# Patient Record
Sex: Female | Born: 1955 | Race: White | Hispanic: No | Marital: Married | Smoking: Former smoker
Health system: Southern US, Community
[De-identification: ages and names within clinical notes are randomized; demographics above are authoritative.]

## PROBLEM LIST (undated history)

## (undated) DIAGNOSIS — E785 Hyperlipidemia, unspecified: Secondary | ICD-10-CM

## (undated) DIAGNOSIS — E039 Hypothyroidism, unspecified: Secondary | ICD-10-CM

## (undated) DIAGNOSIS — G4700 Insomnia, unspecified: Secondary | ICD-10-CM

## (undated) DIAGNOSIS — K219 Gastro-esophageal reflux disease without esophagitis: Secondary | ICD-10-CM

## (undated) DIAGNOSIS — M1711 Unilateral primary osteoarthritis, right knee: Secondary | ICD-10-CM

## (undated) DIAGNOSIS — M0579 Rheumatoid arthritis with rheumatoid factor of multiple sites without organ or systems involvement: Secondary | ICD-10-CM

## (undated) HISTORY — PX: OVARIAN CYST REMOVAL: SHX89

---

## 2007-07-16 ENCOUNTER — Ambulatory Visit: Payer: Self-pay | Admitting: Internal Medicine

## 2007-07-21 ENCOUNTER — Ambulatory Visit: Payer: Self-pay | Admitting: Internal Medicine

## 2008-11-01 ENCOUNTER — Emergency Department: Payer: Self-pay | Admitting: Emergency Medicine

## 2008-12-09 ENCOUNTER — Ambulatory Visit: Payer: Self-pay | Admitting: Unknown Physician Specialty

## 2008-12-17 ENCOUNTER — Ambulatory Visit: Payer: Self-pay | Admitting: Unknown Physician Specialty

## 2010-01-11 ENCOUNTER — Ambulatory Visit: Payer: Self-pay | Admitting: Unknown Physician Specialty

## 2011-02-06 ENCOUNTER — Ambulatory Visit: Payer: Self-pay | Admitting: Unknown Physician Specialty

## 2011-05-30 ENCOUNTER — Ambulatory Visit: Payer: Self-pay | Admitting: Obstetrics and Gynecology

## 2011-05-30 LAB — BASIC METABOLIC PANEL
Calcium, Total: 9 mg/dL (ref 8.5–10.1)
Chloride: 108 mmol/L — ABNORMAL HIGH (ref 98–107)
Co2: 26 mmol/L (ref 21–32)
EGFR (African American): >60
EGFR (Non-African Amer.): >60
Glucose: 100 mg/dL — ABNORMAL HIGH (ref 65–99)
Osmolality: 283 (ref 275–301)
Sodium: 141 mmol/L (ref 136–145)

## 2011-05-30 LAB — WBC: WBC: 8 10*3/uL (ref 3.6–11.0)

## 2011-06-08 ENCOUNTER — Ambulatory Visit: Payer: Self-pay | Admitting: Obstetrics and Gynecology

## 2012-08-08 ENCOUNTER — Emergency Department: Payer: Self-pay | Admitting: Emergency Medicine

## 2012-08-13 ENCOUNTER — Ambulatory Visit: Payer: Self-pay | Admitting: Physician Assistant

## 2012-08-25 ENCOUNTER — Ambulatory Visit: Payer: Self-pay | Admitting: Internal Medicine

## 2013-08-19 ENCOUNTER — Ambulatory Visit (INDEPENDENT_AMBULATORY_CARE_PROVIDER_SITE_OTHER): Payer: BC Managed Care – PPO

## 2013-08-19 ENCOUNTER — Encounter: Payer: Self-pay | Admitting: Podiatry

## 2013-08-19 ENCOUNTER — Ambulatory Visit (INDEPENDENT_AMBULATORY_CARE_PROVIDER_SITE_OTHER): Payer: BC Managed Care – PPO | Admitting: Podiatry

## 2013-08-19 VITALS — Resp 16 | Ht 63.0 in | Wt 172.0 lb

## 2013-08-19 DIAGNOSIS — M79673 Pain in unspecified foot: Secondary | ICD-10-CM

## 2013-08-19 DIAGNOSIS — M79609 Pain in unspecified limb: Secondary | ICD-10-CM

## 2013-08-19 DIAGNOSIS — M722 Plantar fascial fibromatosis: Secondary | ICD-10-CM

## 2013-08-19 MED ORDER — MELOXICAM 15 MG PO TABS: 15.0000 mg | ORAL_TABLET | Freq: Every day | ORAL | Status: DC

## 2013-08-19 MED ORDER — METHYLPREDNISOLONE (PAK) 4 MG PO TABS: ORAL_TABLET | ORAL | Status: DC

## 2013-08-19 NOTE — Progress Notes (Signed)
   Subjective:    Patient ID: Alejandra Wilson, female    DOB: 09-03-1955, 58 y.o.   MRN: [id number]  HPI Comments: i have pain in my left heel. If i walk or stand it hurts. Its been going for about 2 months and its getting worse. i have tried different shoes, otc inserts, and aleve. i have ingrown toenails on all of my toes. i cut my toenails.  Foot Pain      Review of Systems  Musculoskeletal:       Joint pain  All other systems reviewed and are negative.      Objective:   Physical Exam: I have reviewed her past medical history medications allergies surgeries social history and review of systems. Pulses are strongly palpable. Muscle strength is 5 over 5 dorsiflexors plantar inverters everters all intrinsic musculature is intact. Orthopedic evaluation demonstrates pain on palpation medial continued tubercle to the left heel. Radiographic evaluation demonstrates all joints distal to the ankle a full range of motion without crepitation.        Assessment & Plan:  Assessment plantar fasciitis left.  Plan: Injected her left heel today Sterapred Dosepak to be followed on a plantar fascial strapping and a night splint. Medrol Dosepak to be followed by [facility] both oral and written home-going instructions were provided for stretching and ice therapy followup with me in one month

## 2013-08-19 NOTE — Patient Instructions (Signed)
Plantar Fasciitis (Heel Spur Syndrome) with Rehab The plantar fascia is a fibrous, ligament-like, soft-tissue structure that spans the bottom of the foot. Plantar fasciitis is a condition that causes pain in the foot due to inflammation of the tissue. SYMPTOMS   Pain and tenderness on the underneath side of the foot.  Pain that worsens with standing or walking. CAUSES  Plantar fasciitis is caused by irritation and injury to the plantar fascia on the underneath side of the foot. Common mechanisms of injury include:  Direct trauma to bottom of the foot.  Damage to a small nerve that runs under the foot where the main fascia attaches to the heel bone.  Stress placed on the plantar fascia due to bone spurs. RISK INCREASES WITH:   Activities that place stress on the plantar fascia (running, jumping, pivoting, or cutting).  Poor strength and flexibility.  Improperly fitted shoes.  Tight calf muscles.  Flat feet.  Failure to warm-up properly before activity.  Obesity. PREVENTION  Warm up and stretch properly before activity.  Allow for adequate recovery between workouts.  Maintain physical fitness:  Strength, flexibility, and endurance.  Cardiovascular fitness.  Maintain a health body weight.  Avoid stress on the plantar fascia.  Wear properly fitted shoes, including arch supports for individuals who have flat feet. PROGNOSIS  If treated properly, then the symptoms of plantar fasciitis usually resolve without surgery. However, occasionally surgery is necessary. RELATED COMPLICATIONS   Recurrent symptoms that may result in a chronic condition.  Problems of the lower back that are caused by compensating for the injury, such as limping.  Pain or weakness of the foot during push-off following surgery.  Chronic inflammation, scarring, and partial or complete fascia tear, occurring more often from repeated injections. TREATMENT  Treatment initially involves the use of  ice and medication to help reduce pain and inflammation. The use of strengthening and stretching exercises may help reduce pain with activity, especially stretches of the Achilles tendon. These exercises may be performed at home or with a therapist. Your caregiver may recommend that you use heel cups of arch supports to help reduce stress on the plantar fascia. Occasionally, corticosteroid injections are given to reduce inflammation. If symptoms persist for greater than 6 months despite non-surgical (conservative), then surgery may be recommended.  MEDICATION   If pain medication is necessary, then nonsteroidal anti-inflammatory medications, such as aspirin and ibuprofen, or other minor pain relievers, such as acetaminophen, are often recommended.  Do not take pain medication within 7 days before surgery.  Prescription pain relievers may be given if deemed necessary by your caregiver. Use only as directed and only as much as you need.  Corticosteroid injections may be given by your caregiver. These injections should be reserved for the most serious cases, because they may only be given a certain number of times. HEAT AND COLD  Cold treatment (icing) relieves pain and reduces inflammation. Cold treatment should be applied for 10 to 15 minutes every 2 to 3 hours for inflammation and pain and immediately after any activity that aggravates your symptoms. Use ice packs or massage the area with a piece of ice (ice massage).  Heat treatment may be used prior to performing the stretching and strengthening activities prescribed by your caregiver, physical therapist, or athletic trainer. Use a heat pack or soak the injury in warm water. SEEK IMMEDIATE MEDICAL CARE IF:  Treatment seems to offer no benefit, or the condition worsens.  Any medications produce adverse side effects. EXERCISES RANGE   OF MOTION (ROM) AND STRETCHING EXERCISES - Plantar Fasciitis (Heel Spur Syndrome) These exercises may help you  when beginning to rehabilitate your injury. Your symptoms may resolve with or without further involvement from your physician, physical therapist or athletic trainer. While completing these exercises, remember:   Restoring tissue flexibility helps normal motion to return to the joints. This allows healthier, less painful movement and activity.  An effective stretch should be held for at least 30 seconds.  A stretch should never be painful. You should only feel a gentle lengthening or release in the stretched tissue. RANGE OF MOTION - Toe Extension, Flexion  Sit with your right / left leg crossed over your opposite knee.  Grasp your toes and gently pull them back toward the top of your foot. You should feel a stretch on the bottom of your toes and/or foot.  Hold this stretch for __________ seconds.  Now, gently pull your toes toward the bottom of your foot. You should feel a stretch on the top of your toes and or foot.  Hold this stretch for __________ seconds. Repeat __________ times. Complete this stretch __________ times per day.  RANGE OF MOTION - Ankle Dorsiflexion, Active Assisted  Remove shoes and sit on a chair that is preferably not on a carpeted surface.  Place right / left foot under knee. Extend your opposite leg for support.  Keeping your heel down, slide your right / left foot back toward the chair until you feel a stretch at your ankle or calf. If you do not feel a stretch, slide your bottom forward to the edge of the chair, while still keeping your heel down.  Hold this stretch for __________ seconds. Repeat __________ times. Complete this stretch __________ times per day.  STRETCH  Gastroc, Standing  Place hands on wall.  Extend right / left leg, keeping the front knee somewhat bent.  Slightly point your toes inward on your back foot.  Keeping your right / left heel on the floor and your knee straight, shift your weight toward the wall, not allowing your back to  arch.  You should feel a gentle stretch in the right / left calf. Hold this position for __________ seconds. Repeat __________ times. Complete this stretch __________ times per day. STRETCH  Soleus, Standing  Place hands on wall.  Extend right / left leg, keeping the other knee somewhat bent.  Slightly point your toes inward on your back foot.  Keep your right / left heel on the floor, bend your back knee, and slightly shift your weight over the back leg so that you feel a gentle stretch deep in your back calf.  Hold this position for __________ seconds. Repeat __________ times. Complete this stretch __________ times per day. STRETCH  Gastrocsoleus, Standing  Note: This exercise can place a lot of stress on your foot and ankle. Please complete this exercise only if specifically instructed by your caregiver.   Place the ball of your right / left foot on a step, keeping your other foot firmly on the same step.  Hold on to the wall or a rail for balance.  Slowly lift your other foot, allowing your body weight to press your heel down over the edge of the step.  You should feel a stretch in your right / left calf.  Hold this position for __________ seconds.  Repeat this exercise with a slight bend in your right / left knee. Repeat __________ times. Complete this stretch __________ times per day.    STRENGTHENING EXERCISES - Plantar Fasciitis (Heel Spur Syndrome)  These exercises may help you when beginning to rehabilitate your injury. They may resolve your symptoms with or without further involvement from your physician, physical therapist or athletic trainer. While completing these exercises, remember:   Muscles can gain both the endurance and the strength needed for everyday activities through controlled exercises.  Complete these exercises as instructed by your physician, physical therapist or athletic trainer. Progress the resistance and repetitions only as guided. STRENGTH - Towel  Curls  Sit in a chair positioned on a non-carpeted surface.  Place your foot on a towel, keeping your heel on the floor.  Pull the towel toward your heel by only curling your toes. Keep your heel on the floor.  If instructed by your physician, physical therapist or athletic trainer, add ____________________ at the end of the towel. Repeat __________ times. Complete this exercise __________ times per day. STRENGTH - Ankle Inversion  Secure one end of a rubber exercise band/tubing to a fixed object (table, pole). Loop the other end around your foot just before your toes.  Place your fists between your knees. This will focus your strengthening at your ankle.  Slowly, pull your big toe up and in, making sure the band/tubing is positioned to resist the entire motion.  Hold this position for __________ seconds.  Have your muscles resist the band/tubing as it slowly pulls your foot back to the starting position. Repeat __________ times. Complete this exercises __________ times per day.  Document Released: 04/09/2005 Document Revised: 07/02/2011 Document Reviewed: 07/22/2008 [employer] Patient Information 2014 [employer]. Plantar Fasciitis Plantar fasciitis is a common condition that causes foot pain. It is soreness (inflammation) of the band of tough fibrous tissue on the bottom of the foot that runs from the heel bone (calcaneus) to the ball of the foot. The cause of this soreness may be from excessive standing, poor fitting shoes, running on hard surfaces, being overweight, having an abnormal walk, or overuse (this is common in runners) of the painful foot or feet. It is also common in aerobic exercise dancers and ballet dancers. SYMPTOMS  Most people with plantar fasciitis complain of:  Severe pain in the morning on the bottom of their foot especially when taking the first steps out of bed. This pain recedes after a few minutes of walking.  Severe pain is experienced also during walking  following a long period of inactivity.  Pain is worse when walking barefoot or up stairs DIAGNOSIS   Your caregiver will diagnose this condition by examining and feeling your foot.  Special tests such as X-rays of your foot, are usually not needed. PREVENTION   Consult a sports medicine professional before beginning a new exercise program.  Walking programs offer a good workout. With walking there is a lower chance of overuse injuries common to runners. There is less impact and less jarring of the joints.  Begin all new exercise programs slowly. If problems or pain develop, decrease the amount of time or distance until you are at a comfortable level.  Wear good shoes and replace them regularly.  Stretch your foot and the heel cords at the back of the ankle (Achilles tendon) both before and after exercise.  Run or exercise on even surfaces that are not hard. For example, asphalt is better than pavement.  Do not run barefoot on hard surfaces.  If using a treadmill, vary the incline.  Do not continue to workout if you have foot or joint   problems. Seek professional help if they do not improve. HOME CARE INSTRUCTIONS   Avoid activities that cause you pain until you recover.  Use ice or cold packs on the problem or painful areas after working out.  Only take over-the-counter or prescription medicines for pain, discomfort, or fever as directed by your caregiver.  Soft shoe inserts or athletic shoes with air or gel sole cushions may be helpful.  If problems continue or become more severe, consult a sports medicine caregiver or your own health care provider. Cortisone is a potent anti-inflammatory medication that may be injected into the painful area. You can discuss this treatment with your caregiver. MAKE SURE YOU:   Understand these instructions.  Will watch your condition.  Will get help right away if you are not doing well or get worse. Document Released: 01/02/2001 Document  Revised: 07/02/2011 Document Reviewed: 03/03/2008 [employer] Patient Information 2014 [employer].  

## 2013-09-28 ENCOUNTER — Ambulatory Visit (INDEPENDENT_AMBULATORY_CARE_PROVIDER_SITE_OTHER): Payer: BC Managed Care – PPO | Admitting: Podiatry

## 2013-09-28 VITALS — BP 150/83 | HR 61 | Resp 16

## 2013-09-28 DIAGNOSIS — M722 Plantar fascial fibromatosis: Secondary | ICD-10-CM

## 2013-09-28 NOTE — Progress Notes (Signed)
She presents today for followup of her plantar fasciitis left heel. She states it continues to hurt a regular basis. He starting to affect her daily activities.  Objective: R. signs are stable she is alert and oriented x3. Pulses are strongly palpable bilateral.  Assessment: Pain in limb secondary to plantar fasciitis left heel.  Plan: Injected the left heel once again today continue all other conservative therapies. She was scanned for a pair orthotics. I will followup with her in one month.

## 2013-10-09 ENCOUNTER — Encounter: Payer: Self-pay | Admitting: *Deleted

## 2013-10-09 NOTE — Progress Notes (Signed)
Sent pt post card letting her know orthotics are here.

## 2013-10-14 ENCOUNTER — Ambulatory Visit (INDEPENDENT_AMBULATORY_CARE_PROVIDER_SITE_OTHER): Payer: BC Managed Care – PPO | Admitting: Podiatry

## 2013-10-14 ENCOUNTER — Encounter: Payer: Self-pay | Admitting: Podiatry

## 2013-10-14 DIAGNOSIS — M722 Plantar fascial fibromatosis: Secondary | ICD-10-CM

## 2013-10-14 NOTE — Progress Notes (Signed)
Pt presents for orthotic pick up written and verbal instructions are given 

## 2013-10-14 NOTE — Patient Instructions (Signed)

## 2013-11-09 ENCOUNTER — Ambulatory Visit (INDEPENDENT_AMBULATORY_CARE_PROVIDER_SITE_OTHER): Payer: BC Managed Care – PPO | Admitting: Podiatry

## 2013-11-09 ENCOUNTER — Encounter: Payer: Self-pay | Admitting: Podiatry

## 2013-11-09 DIAGNOSIS — M722 Plantar fascial fibromatosis: Secondary | ICD-10-CM

## 2013-11-09 MED ORDER — MELOXICAM 15 MG PO TABS: 15.0000 mg | ORAL_TABLET | Freq: Every day | ORAL | Status: DC

## 2013-11-09 NOTE — Progress Notes (Signed)
She presents today for followup of her plantar fasciitis and her orthotics. 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 She relates that the heel pain seems to be resolving.  Objective: Vital signs are stable she is alert and oriented x3. She has minimal pain on palpation medial calcaneal tubercle of the left heel much less than previously noted.  Assessment: Plantar fasciitis resolving left heel.  Plan: Continue all conservative therapies and the use of the orthotics

## 2013-11-25 ENCOUNTER — Ambulatory Visit: Payer: Self-pay | Admitting: Physician Assistant

## 2014-12-29 ENCOUNTER — Other Ambulatory Visit: Payer: Self-pay | Admitting: Physician Assistant

## 2014-12-29 DIAGNOSIS — Z1231 Encounter for screening mammogram for malignant neoplasm of breast: Secondary | ICD-10-CM

## 2015-01-06 ENCOUNTER — Ambulatory Visit: Payer: Self-pay

## 2015-01-10 ENCOUNTER — Ambulatory Visit: Admission: RE | Admit: 2015-01-10 | Payer: Self-pay | Source: Ambulatory Visit

## 2015-01-18 ENCOUNTER — Ambulatory Visit
Admission: RE | Admit: 2015-01-18 | Discharge: 2015-01-18 | Disposition: A | Discharge status: Home / Self Care | Payer: BLUE CROSS/BLUE SHIELD | Source: Ambulatory Visit | Attending: Physician Assistant | Admitting: Physician Assistant

## 2015-01-18 DIAGNOSIS — Z1231 Encounter for screening mammogram for malignant neoplasm of breast: Secondary | ICD-10-CM | POA: Diagnosis not present

## 2016-02-07 ENCOUNTER — Other Ambulatory Visit: Payer: Self-pay | Admitting: Physician Assistant

## 2016-02-07 DIAGNOSIS — Z1231 Encounter for screening mammogram for malignant neoplasm of breast: Secondary | ICD-10-CM

## 2016-03-08 ENCOUNTER — Ambulatory Visit
Admission: RE | Admit: 2016-03-08 | Discharge: 2016-03-08 | Disposition: A | Discharge status: Home / Self Care | Payer: BLUE CROSS/BLUE SHIELD | Source: Ambulatory Visit | Attending: Physician Assistant | Admitting: Physician Assistant

## 2016-03-08 DIAGNOSIS — Z1231 Encounter for screening mammogram for malignant neoplasm of breast: Secondary | ICD-10-CM | POA: Diagnosis not present

## 2016-07-05 ENCOUNTER — Ambulatory Visit: Payer: BLUE CROSS/BLUE SHIELD | Attending: Rheumatology | Admitting: Occupational Therapy

## 2016-07-05 DIAGNOSIS — M79642 Pain in left hand: Secondary | ICD-10-CM

## 2016-07-05 DIAGNOSIS — M25642 Stiffness of left hand, not elsewhere classified: Secondary | ICD-10-CM | POA: Diagnosis present

## 2016-07-05 DIAGNOSIS — M79641 Pain in right hand: Secondary | ICD-10-CM | POA: Insufficient documentation

## 2016-07-05 DIAGNOSIS — M25641 Stiffness of right hand, not elsewhere classified: Secondary | ICD-10-CM | POA: Insufficient documentation

## 2016-07-05 NOTE — Therapy (Signed)
[facility] [address] [place], [address] Phone: [phone]   Fax:  [phone]  Occupational Therapy Evaluation  Patient Details  Name: Alejandra Wilson MRN: [id number] Date of Birth: July 17, 1955 Referring Provider: Jefm Bryant   Encounter Date: 07/05/2016      OT End of Session - 07/05/16 0902    Visit Number 1   Number of Visits 3   Date for OT Re-Evaluation 08/02/16   OT Start Time 0808   OT Stop Time 0900   OT Time Calculation (min) 52 min   Activity Tolerance Patient tolerated treatment well   Behavior During Therapy [facility] for tasks assessed/performed      No past medical history on file.  Past Surgical History:  Procedure Laterality Date  . OVARIAN CYST REMOVAL      There were no vitals filed for this visit.      Subjective Assessment - 07/05/16 0814    Subjective  Stiffness in am - pain increase in digits as the days goes, hard time opening  objects - did wear wrist braces - some times more than other - glove with copper when cold    Patient Stated Goals Want to get the pain better and maintain ROM and strength    Currently in Pain? Yes   Pain Score 5    Pain Location Hand   Pain Orientation Right;Left   Pain Descriptors / Indicators Aching   Pain Type Chronic pain           [facility] OT Assessment - 07/05/16 0001      Assessment   Diagnosis Bilateral hands pain    Referring Provider [facility]    Onset Date 06/25/16     Precautions   Required Braces or Orthoses --  wrist braces at times     Home  Environment   Lives With Family     Prior Function   Vocation Full time employment   Leisure Day care teacher, mowing , yard work ,Loss adjuster, chartered , own house wor- R hand dominant k      Strength   Right Hand Grip (lbs) 36   Right Hand Lateral Pinch 11 lbs   Right Hand 3 Point Pinch 7 lbs   Left Hand Grip (lbs) 35   Left Hand Lateral Pinch 10 lbs   Left Hand 3 Point Pinch 7 lbs     Right Hand AROM   R Thumb Opposition to Index --  Opposition to base of 5th  some discomfort at 4thad nth   R Index  MCP 0-90 70 Degrees   R Index PIP 0-100 100 Degrees   R Long  MCP 0-90 75 Degrees   R Long PIP 0-100 100 Degrees   R Ring  MCP 0-90 75 Degrees   R Ring PIP 0-100 100 Degrees   R Little  MCP 0-90 80 Degrees   R Little PIP 0-100 95 Degrees     Left Hand AROM   L Thumb Opposition to Index --  Opposition to base of 5th   L Index  MCP 0-90 85 Degrees   L Index PIP 0-100 90 Degrees   L Long  MCP 0-90 80 Degrees   L Long PIP 0-100 90 Degrees   L Ring  MCP 0-90 80 Degrees   L Ring PIP 0-100 90 Degrees   L Little  MCP 0-90 80 Degrees   L Little PIP 0-100 90 Degrees  Contrast to be done 2 x day  Tendon glides  Opposition to all digits Isotoner glove at night time on bilateral hands 10 reps each   Joint protection done and AE - reviewed  Hand out provided                  OT Education - 07/05/16 0902    Education provided Yes   Education Details Findings and HEP and jointprotection/AE   Person(s) Educated Patient   Methods Explanation;Demonstration;Verbal cues;Handout;Tactile cues   Comprehension Verbal cues required;Returned demonstration;Verbalized understanding          OT Short Term Goals - 07/05/16 1730      OT SHORT TERM GOAL #1   Title Pain on PRHWE improve with at least 15 points    Baseline Pain on PRHWE at eval 32/50    Time 3   Period Weeks   Status New     OT SHORT TERM GOAL #2   Title Pt to be ind in HEP to increase  and maintain ROM in digits  and  decrease pain    Baseline very little knowledge    Time 2   Period Weeks   Status New           OT Long Term Goals - 07/05/16 1733      OT LONG TERM GOAL #1   Title Pt to verbalize 3 joint protection and AE to use at home and work to increase ROM and decrease pain in bilateral hands    Baseline no knowledge    Time 4   Period Weeks   Status New     OT  LONG TERM GOAL #2   Title Bilateral grip strength and 3 point grip  improve with 1-3 lbs to report increase use of hands in ADL's    Baseline see flowsheet    Time 4   Period Weeks   Status New               Plan - 07/05/16 1727    Clinical Impression Statement Pt show increase pain in bilateral hands - with R worse than L , but ROM in L worse than R - pt show increase edema over MC's in bilateral hands - with pain increasing as the day goes or increase activities - decrease flexion of digits, decrease  grip and prehension strength - all limiting her functional use of hands in ADL's and IADL's - pt denies numbness or sensory changes    Rehab Potential Good   Clinical Impairments Affecting Rehab Potential chronic condition   OT Frequency 1x / week   OT Duration 4 weeks      Patient will benefit from skilled therapeutic intervention in order to improve the following deficits and impairments:  Impaired flexibility, Increased edema, Impaired UE functional use, Pain, Decreased strength, Decreased knowledge of use of DME  Visit Diagnosis: Pain in left hand - Plan: Ot plan of care cert/re-cert  Pain in right hand - Plan: Ot plan of care cert/re-cert  Stiffness of left hand, not elsewhere classified - Plan: Ot plan of care cert/re-cert  Stiffness of right hand, not elsewhere classified - Plan: Ot plan of care cert/re-cert    Problem List There are no active problems to display for this patient.   Rosalyn Gess OTR/LCLT 07/05/2016, 5:39 PM  [place] [address], [place], [address] Phone: [phone]   Fax:  [phone]  Name:  Alejandra Wilson MRN: [id number] Date of Birth: 05-28-1955

## 2016-07-05 NOTE — Patient Instructions (Signed)
Contrast to be done 2 x day  Tendon glides  Opposition to all digits Isotoner glove at night time on bilateral hands 10 reps each   Joint protection done and AE - reviewed  Hand out provided

## 2016-07-12 ENCOUNTER — Ambulatory Visit: Payer: BLUE CROSS/BLUE SHIELD | Admitting: Occupational Therapy

## 2016-07-12 DIAGNOSIS — M79641 Pain in right hand: Secondary | ICD-10-CM

## 2016-07-12 DIAGNOSIS — M79642 Pain in left hand: Secondary | ICD-10-CM

## 2016-07-12 DIAGNOSIS — M25641 Stiffness of right hand, not elsewhere classified: Secondary | ICD-10-CM

## 2016-07-12 DIAGNOSIS — M25642 Stiffness of left hand, not elsewhere classified: Secondary | ICD-10-CM

## 2016-07-12 NOTE — Patient Instructions (Addendum)
Same HEP - contrast and   Not force intrinsic fist , or full fist  Change  Tendon glides  - to stop when feeling pull with intrinsic fist and full fist to 3 cm foam block  10 reps  And all digits at same time- pt lacking with 4th and 5th  Opposition to all digits - focus on oval and only touching Isotoner glove at night time on bilateral hands ( can wear during day if needed)  10 reps each   Joint protection and  AE  reviewed  Again - and reinforce importance - not to work with tight fist - mostly L hand - because unable to make full fist  Hand out provided

## 2016-07-12 NOTE — Therapy (Signed)
[facility] [address] [place], [address] Phone: [phone]   Fax:  [phone]  Occupational Therapy Treatment  Patient Details  Name: Alejandra Wilson MRN: [id number] Date of Birth: 1955/09/14 Referring Provider: Jefm Bryant   Encounter Date: 07/12/2016      OT End of Session - 07/12/16 0821    Visit Number 2   Number of Visits 3   Date for OT Re-Evaluation 08/02/16   OT Start Time 0809   OT Stop Time 0848   OT Time Calculation (min) 39 min   Activity Tolerance Patient tolerated treatment well   Behavior During Therapy [facility] for tasks assessed/performed      No past medical history on file.  Past Surgical History:  Procedure Laterality Date  . OVARIAN CYST REMOVAL      There were no vitals filed for this visit.      Subjective Assessment - 07/12/16 0809    Subjective  Hands about the same - still hurting - but L hand I cannot make as good of fist like before - pull over the back of fingers - I did try and pick up with my forearm, fatter ,bigger handles , did get a jar opener    Patient Stated Goals Want to get the pain better and maintain ROM and strength    Currently in Pain? Yes   Pain Score 6    Pain Location Hand   Pain Orientation Right;Left   Pain Descriptors / Indicators Aching   Pain Type Chronic pain            [facility] OT Assessment - 07/12/16 0001      Strength   Right Hand Grip (lbs) 36   Right Hand Lateral Pinch 15 lbs   Right Hand 3 Point Pinch 10 lbs   Left Hand Grip (lbs) --  NT pain    Left Hand Lateral Pinch 9 lbs   Left Hand 3 Point Pinch 9 lbs     Right Hand AROM   R Index  MCP 0-90 80 Degrees   R Long  MCP 0-90 82 Degrees   R Ring  MCP 0-90 75 Degrees   R Little  MCP 0-90 85 Degrees     Left Hand AROM   L Index  MCP 0-90 85 Degrees   L Long  MCP 0-90 85 Degrees   L Long PIP 0-100 80 Degrees   L Ring  MCP 0-90 85 Degrees   L Ring PIP 0-100 80 Degrees    L Little  MCP 0-90 80 Degrees   L Little PIP 0-100 80 Degrees                  OT Treatments/Exercises (OP) - 07/12/16 0001      RUE Paraffin   Number Minutes Paraffin 10 Minutes   RUE Paraffin Location Hand   Comments at [facility] to decrease pain and stiffness      LUE Paraffin   Number Minutes Paraffin 10 Minutes   LUE Paraffin Location Hand   Comments at [facility] to decrease stiffness and pain       Measurements taken See flow sheet   Paraffin done to bilateral hands  Soft tissue mobs - MC spreads and joint mobs to each [facility]  Massage to lateral bands of PIP's to increase ROM and decrease pain   Review HEP with pt - to mod A  Not force intrinsic fist , or full  fist  Change  Tendon glides  - to stop when feeling pull with intrinsic fist and full fist to 3 cm foam block  10 reps  And all digits at same time- pt lacking with 4th and 5th  Opposition to all digits - focus on oval and only touching Isotoner glove at night time on bilateral hands ( can wear during day if needed)  10 reps each   Joint protection and  AE  reviewed  Again - and reinforce importance - not to work with tight fist - mostly L hand - because unable to make full fist  Hand out provided            OT Education - 07/12/16 0821    Education provided Yes   Education Details HEP and changes - as well as reinforce joint protection    Person(s) Educated Patient   Methods Explanation;Demonstration;Verbal cues;Tactile cues   Comprehension Verbal cues required;Returned demonstration;Verbalized understanding          OT Short Term Goals - 07/05/16 1730      OT SHORT TERM GOAL #1   Title Pain on PRHWE improve with at least 15 points    Baseline Pain on PRHWE at eval 32/50    Time 3   Period Weeks   Status New     OT SHORT TERM GOAL #2   Title Pt to be ind in HEP to increase  and maintain ROM in digits  and  decrease pain    Baseline very little knowledge    Time 2   Period Weeks   Status New            OT Long Term Goals - 07/05/16 1733      OT LONG TERM GOAL #1   Title Pt to verbalize 3 joint protection and AE to use at home and work to increase ROM and decrease pain in bilateral hands    Baseline no knowledge    Time 4   Period Weeks   Status New     OT LONG TERM GOAL #2   Title Bilateral grip strength and 3 point grip  improve with 1-3 lbs to report increase use of hands in ADL's    Baseline see flowsheet    Time 4   Period Weeks   Status New               Plan - 07/12/16 9323    Clinical Impression Statement Pt show increase ROM in MC's flexion - but L PIP's decrease and increase pain this date - pt not to force intrinsic fist and composite fist - change HEP and reinforce use of joint protection - responded good to paraffin - decrease pain - recommend to use at home    Rehab Potential Good   Clinical Impairments Affecting Rehab Potential chronic condition   OT Frequency 2x / week   OT Duration 4 weeks   OT Treatment/Interventions Self-care/ADL training;Parrafin;Manual Therapy;Passive range of motion;Therapeutic exercises;Splinting;Fluidtherapy;Patient/family education   Plan assess progress    OT Home Exercise Plan see pt instruction    Consulted and Agree with Plan of Care Patient      Patient will benefit from skilled therapeutic intervention in order to improve the following deficits and impairments:  Impaired flexibility, Increased edema, Impaired UE functional use, Pain, Decreased strength, Decreased knowledge of use of DME  Visit Diagnosis: Pain in left hand  Pain in right hand  Stiffness of left hand, not elsewhere classified  Stiffness of right hand, not elsewhere classified    Problem List There are no active problems to display for this patient.   Rosalyn Gess OTR/L,CLT 07/12/2016, 8:51 AM  [facility] [address] [place], [address] Phone:  [phone]   Fax:  [phone]  Name: Alejandra Wilson MRN: [id number] Date of Birth: 10/19/1955

## 2016-07-23 ENCOUNTER — Ambulatory Visit: Payer: BLUE CROSS/BLUE SHIELD | Attending: Rheumatology | Admitting: Occupational Therapy

## 2016-07-23 DIAGNOSIS — M25641 Stiffness of right hand, not elsewhere classified: Secondary | ICD-10-CM

## 2016-07-23 DIAGNOSIS — M79641 Pain in right hand: Secondary | ICD-10-CM | POA: Diagnosis present

## 2016-07-23 DIAGNOSIS — M25642 Stiffness of left hand, not elsewhere classified: Secondary | ICD-10-CM

## 2016-07-23 DIAGNOSIS — M79642 Pain in left hand: Secondary | ICD-10-CM

## 2016-07-23 NOTE — Patient Instructions (Signed)
ed pt on PROM for composite flexion on the L more than R pain free range   Pt to do with R hand to Pt ed and reinforce importance in maintaining her ROM  If increase pain  Also to do AROM - blocked for PIP flexion - but not force or tense up   Discuss circle for increase pain , decrease ROM and decrease strength- Recommend paraffin bath for pt to use at home

## 2016-07-23 NOTE — Therapy (Signed)
[facility] [address] [place], [address] Phone: [phone]   Fax:  [phone]  Occupational Therapy Treatment  Patient Details  Name: GISSEL KEILMAN MRN: [id number] Date of Birth: [birth year] Referring Provider: Jefm Bryant   Encounter Date: 07/23/2016      OT End of Session - 07/23/16 1002    Visit Number 3   Number of Visits 4   Date for OT Re-Evaluation 08/02/16   OT Start Time 0918   OT Stop Time 0948   OT Time Calculation (min) 30 min   Activity Tolerance Patient tolerated treatment well;Patient limited by pain   Behavior During Therapy [facility] for tasks assessed/performed      No past medical history on file.  Past Surgical History:  Procedure Laterality Date  . OVARIAN CYST REMOVAL      There were no vitals filed for this visit.      Subjective Assessment - 07/23/16 0932    Subjective  I told you, I have good days and bad days - pain since last night - did not sleep good- did sleep with the isotoner glove    Patient Stated Goals Want to get the pain better and maintain ROM and strength    Currently in Pain? Yes   Pain Score 9    Pain Location Hand   Pain Orientation Left   Pain Descriptors / Indicators Aching   Pain Type Chronic pain                      OT Treatments/Exercises (OP) - 07/23/16 0001      RUE Paraffin   Number Minutes Paraffin 10 Minutes   RUE Paraffin Location Hand   Comments AT [facility] to decrease pain and increase ROM      LUE Paraffin   Number Minutes Paraffin 10 Minutes   LUE Paraffin Location Hand   Comments at [facility] to decrease pain and increase ROM       Pt arrive with decrease AROM and increase pain  in bilateral hands -upon asking to make fist on L - pt only flex MC's - no PIP flexion  Paraffin done to bilateral hands Soft tissue mobs to lateral bands of PIP's and joint mobs to MC's prior to ROM   Still unable to flex PIP's  Done and ed pt  on PROM for composite flexion on the L more than R pain free rnage Full flexion PROM for 2nd and 5th - no pain  Some tightness in 3rd and 4th - partial ROM  Pt to do with R hand to SHowed increase ROM in bilateral hands compare to arrival Pt ed and reinforce importance in maintaining her ROM  If increase pain  Also to do AROM - blocked for PIP flexion - but not force or tense up   Discuss circle for increase pain , decrease ROM and decrease strength- Recommend paraffin bath for pt to use at home  Pain decrease to 4/10 from 9/10             OT Education - 07/23/16 1002    Education provided Yes   Education Details HEP and add painfree composite flexion PROM    Person(s) Educated Patient   Methods Explanation;Tactile cues;Verbal cues;Demonstration;Handout   Comprehension Verbalized understanding;Returned demonstration;Verbal cues required          OT Short Term Goals - 07/05/16 1730      OT SHORT TERM GOAL #1  Title Pain on PRHWE improve with at least 15 points    Baseline Pain on PRHWE at eval 32/50    Time 3   Period Weeks   Status New     OT SHORT TERM GOAL #2   Title Pt to be ind in HEP to increase  and maintain ROM in digits  and  decrease pain    Baseline very little knowledge    Time 2   Period Weeks   Status New           OT Long Term Goals - 07/05/16 1733      OT LONG TERM GOAL #1   Title Pt to verbalize 3 joint protection and AE to use at home and work to increase ROM and decrease pain in bilateral hands    Baseline no knowledge    Time 4   Period Weeks   Status New     OT LONG TERM GOAL #2   Title Bilateral grip strength and 3 point grip  improve with 1-3 lbs to report increase use of hands in ADL's    Baseline see flowsheet    Time 4   Period Weeks   Status New               Plan - 07/23/16 1003    Clinical Impression Statement Pt show increase pain this date - report she has good days and bad days- did not do more activities  12-24 hrs ago - pain did decrease with paraffin from 9/10 - to 4/10 - reinforce pt to do composite PROM to each finger in painfree range at home to no loose ROM if have increase pain - recommend paraffin bath - pt  is on voltaren and cont to have increase edema over MC's -  doing contrast and isotoner gloves - will follow upin 2 wks with pt    Rehab Potential Good   Clinical Impairments Affecting Rehab Potential chronic condition   OT Frequency 1x / week   OT Duration 2 weeks   OT Treatment/Interventions Self-care/ADL training;Parrafin;Manual Therapy;Passive range of motion;Therapeutic exercises;Splinting;Fluidtherapy;Patient/family education   Plan assess pain - if got paraffin bath - performance of ROM HEP    OT Home Exercise Plan see pt instruction    Consulted and Agree with Plan of Care Patient      Patient will benefit from skilled therapeutic intervention in order to improve the following deficits and impairments:  Impaired flexibility, Increased edema, Impaired UE functional use, Pain, Decreased strength, Decreased knowledge of use of DME  Visit Diagnosis: Pain in left hand  Pain in right hand  Stiffness of left hand, not elsewhere classified  Stiffness of right hand, not elsewhere classified    Problem List There are no active problems to display for this patient.   Rosalyn Gess OTR/L,CLT 07/23/2016, 10:06 AM  [facility] [address] [place], [address] Phone: [phone]   Fax:  [phone]  Name: BRIAUNNA GRINDSTAFF MRN: [id number] Date of Birth: 1955/06/24

## 2016-08-09 ENCOUNTER — Ambulatory Visit: Payer: BLUE CROSS/BLUE SHIELD | Admitting: Occupational Therapy

## 2016-08-14 ENCOUNTER — Ambulatory Visit: Payer: BLUE CROSS/BLUE SHIELD | Admitting: Occupational Therapy

## 2016-08-14 DIAGNOSIS — M79642 Pain in left hand: Secondary | ICD-10-CM | POA: Diagnosis not present

## 2016-08-14 DIAGNOSIS — M79641 Pain in right hand: Secondary | ICD-10-CM

## 2016-08-14 DIAGNOSIS — M25641 Stiffness of right hand, not elsewhere classified: Secondary | ICD-10-CM

## 2016-08-14 DIAGNOSIS — M25642 Stiffness of left hand, not elsewhere classified: Secondary | ICD-10-CM

## 2016-08-14 NOTE — Therapy (Signed)
[facility] [address], [place], [address] Phone: [phone]   Fax:  [phone]  Occupational Therapy Treatment  Patient Details  Name: Alejandra Wilson MRN: [id number] Date of Birth: [birth year] Referring Provider: Jefm Bryant   Encounter Date: 08/14/2016      OT End of Session - 08/14/16 0826    Visit Number 4   Number of Visits 6   Date for OT Re-Evaluation 09/11/16   OT Start Time 0805   OT Stop Time 0840   OT Time Calculation (min) 35 min   Activity Tolerance Patient tolerated treatment well   Behavior During Therapy [facility] for tasks assessed/performed      No past medical history on file.  Past Surgical History:  Procedure Laterality Date  . OVARIAN CYST REMOVAL      There were no vitals filed for this visit.      Subjective Assessment - 08/14/16 0808    Subjective  My L hand still ache, and night time worse - cannot make fist on the L - do heat and then some exercises- the gloves at night time do help - I dropped the other day a pan- R hand doing okay    Patient Stated Goals Want to get the pain better and maintain ROM and strength    Currently in Pain? Yes   Pain Score 4    Pain Location Hand   Pain Orientation Left   Pain Descriptors / Indicators Aching            [facility] OT Assessment - 08/14/16 0001      Strength   Right Hand Grip (lbs) 39   Right Hand Lateral Pinch 16 lbs   Right Hand 3 Point Pinch 11 lbs   Left Hand Grip (lbs) 25   Left Hand Lateral Pinch 10 lbs   Left Hand 3 Point Pinch 8 lbs     Right Hand AROM   R Index  MCP 0-90 75 Degrees   R Long  MCP 0-90 80 Degrees   R Ring  MCP 0-90 85 Degrees   R Little  MCP 0-90 90 Degrees     Left Hand AROM   L Index  MCP 0-90 90 Degrees   L Index PIP 0-100 50 Degrees   L Long  MCP 0-90 90 Degrees   L Long PIP 0-100 70 Degrees   L Ring  MCP 0-90 90 Degrees   L Ring PIP 0-100 60 Degrees   L Little  MCP 0-90 90 Degrees    L Little PIP 0-100 50 Degrees                  OT Treatments/Exercises (OP) - 08/14/16 0001      RUE Paraffin   Number Minutes Paraffin 10 Minutes   RUE Paraffin Location Hand   Comments at [facility] to decrease pain and increase ROM      LUE Paraffin   Number Minutes Paraffin 10 Minutes   LUE Paraffin Location Hand   Comments at [facility] to decrease pain and increase ROM       Pt arrive with decrease AROM still in L hand PIP's and composite flexion  Still increase pain in L more than R  Measurements taken for ROM and grip/prehension - see flowsheet   Discuss use of joint protection principles - pt do use larger joints and built up handles - avoid tight grip -  Use isotoner gloves at  night time - do help    Paraffin done to bilateral hands Soft tissue mobs to lateral bands of PIP's and joint mobs to MC's prior to ROM on L  Still pain with flexion of  PIP's on L - 3rd and 4th worse  Full flexion PROM for 2nd and 5th - no pain  Some tightness in 3rd and 4th - partial ROM and increase pain    Discuss circle for increase pain , decrease ROM and decrease strength- Recommend paraffin bath for pt to use at home - but if swelling in MC's /palm - will recommend contrast  Plan to phone Dr Jefm Bryant for intervention for persistent pain and edema in L MC's             OT Education - 08/14/16 0826    Education provided Yes   Education Details plan for home program and will contact Dr Berenice Primas) Educated Patient   Methods Explanation;Demonstration;Tactile cues;Verbal cues   Comprehension Verbal cues required;Returned demonstration;Verbalized understanding          OT Short Term Goals - 08/14/16 0846      OT SHORT TERM GOAL #1   Title Pain on PRHWE improve with at least 15 points    Baseline Pain on PRHWE at eval 32/50 - pain at AROM still 5/10 on L    Time 3   Period Weeks   Status On-going     OT SHORT TERM GOAL #2   Title Pt to be ind in HEP to  increase  and maintain ROM in digits  and  decrease pain    Baseline R hand improve , L decrease ROM and increase pain    Time 2   Period Weeks   Status On-going           OT Long Term Goals - 08/14/16 0847      OT LONG TERM GOAL #1   Title Pt to verbalize 3 joint protection and AE to use at home and work to increase ROM and decrease pain in bilateral hands    Status Achieved     OT LONG TERM GOAL #2   Title Bilateral grip strength and 3 point grip  improve with 1-3 lbs to report increase use of hands in ADL's    Baseline R hand improve but L decrease since eval    Time 4   Period Weeks   Status On-going               Plan - 08/14/16 0827    Clinical Impression Statement Pt showed increase ROM and grip/prehension in R hand - but since 5 wks ago L hand flexion at PIP's decrease and composite fist - pain and edema still over MC's - will contact Dr Jefm Bryant for intervention to decrase pain - pt still PROM  WNL at L 2nd and 5th - but pain limiting her flexion at 3rd and 4th - and AROM composite fist - at risk fore loosing permanent flexion of digits- cont with contrast and ROM  HEP pain free range    Rehab Potential Good   Clinical Impairments Affecting Rehab Potential chronic condition   OT Frequency Biweekly   OT Duration 4 weeks   OT Treatment/Interventions Self-care/ADL training;Parrafin;Manual Therapy;Passive range of motion;Therapeutic exercises;Splinting;Fluidtherapy;Patient/family education   Plan Phone MD intervention for pain and edema L MC's - will follow up if needed    OT Home Exercise Plan see pt instruction    Consulted and Agree  with Plan of Care Patient      Patient will benefit from skilled therapeutic intervention in order to improve the following deficits and impairments:  Impaired flexibility, Increased edema, Impaired UE functional use, Pain, Decreased strength, Decreased knowledge of use of DME  Visit Diagnosis: Pain in left hand  Pain in right  hand  Stiffness of left hand, not elsewhere classified  Stiffness of right hand, not elsewhere classified    Problem List There are no active problems to display for this patient.   Rosalyn Gess OTR/L,CLT 08/14/2016, 8:49 AM  [facility] [address], [place], [address] Phone: [phone]   Fax:  [phone]  Name: Alejandra Wilson MRN: [id number] Date of Birth: 1955-08-21

## 2016-08-14 NOTE — Patient Instructions (Addendum)
Same ROM , contrast  Compression glove and  Joint protection

## 2016-08-21 DIAGNOSIS — F5101 Primary insomnia: Secondary | ICD-10-CM | POA: Insufficient documentation

## 2017-02-27 ENCOUNTER — Other Ambulatory Visit: Payer: Self-pay | Admitting: Physician Assistant

## 2017-02-27 DIAGNOSIS — Z1231 Encounter for screening mammogram for malignant neoplasm of breast: Secondary | ICD-10-CM

## 2017-03-27 ENCOUNTER — Ambulatory Visit
Admission: RE | Admit: 2017-03-27 | Discharge: 2017-03-27 | Disposition: A | Discharge status: Home / Self Care | Payer: BLUE CROSS/BLUE SHIELD | Source: Ambulatory Visit | Attending: Physician Assistant | Admitting: Physician Assistant

## 2017-03-27 DIAGNOSIS — Z1231 Encounter for screening mammogram for malignant neoplasm of breast: Secondary | ICD-10-CM | POA: Diagnosis present

## 2018-04-30 ENCOUNTER — Other Ambulatory Visit: Payer: Self-pay | Admitting: Physician Assistant

## 2018-04-30 DIAGNOSIS — Z1231 Encounter for screening mammogram for malignant neoplasm of breast: Secondary | ICD-10-CM

## 2018-05-19 ENCOUNTER — Ambulatory Visit
Admission: RE | Admit: 2018-05-19 | Discharge: 2018-05-19 | Disposition: A | Discharge status: Home / Self Care | Payer: BLUE CROSS/BLUE SHIELD | Source: Ambulatory Visit | Attending: Physician Assistant | Admitting: Physician Assistant

## 2018-05-19 DIAGNOSIS — Z1231 Encounter for screening mammogram for malignant neoplasm of breast: Secondary | ICD-10-CM | POA: Diagnosis present

## 2019-06-01 ENCOUNTER — Other Ambulatory Visit: Payer: Self-pay | Admitting: Physician Assistant

## 2019-06-01 DIAGNOSIS — Z1231 Encounter for screening mammogram for malignant neoplasm of breast: Secondary | ICD-10-CM

## 2019-06-02 ENCOUNTER — Other Ambulatory Visit: Payer: Self-pay | Admitting: Student

## 2019-06-02 DIAGNOSIS — M25562 Pain in left knee: Secondary | ICD-10-CM

## 2019-06-02 DIAGNOSIS — S83242A Other tear of medial meniscus, current injury, left knee, initial encounter: Secondary | ICD-10-CM

## 2019-06-02 DIAGNOSIS — M1712 Unilateral primary osteoarthritis, left knee: Secondary | ICD-10-CM

## 2019-06-04 ENCOUNTER — Ambulatory Visit
Admission: RE | Admit: 2019-06-04 | Discharge: 2019-06-04 | Disposition: A | Discharge status: Home / Self Care | Payer: BC Managed Care – PPO | Source: Ambulatory Visit | Attending: Student | Admitting: Student

## 2019-06-04 ENCOUNTER — Ambulatory Visit: Payer: BC Managed Care – PPO

## 2019-06-04 ENCOUNTER — Other Ambulatory Visit: Payer: Self-pay

## 2019-06-04 DIAGNOSIS — M1712 Unilateral primary osteoarthritis, left knee: Secondary | ICD-10-CM | POA: Diagnosis present

## 2019-06-04 DIAGNOSIS — S83242A Other tear of medial meniscus, current injury, left knee, initial encounter: Secondary | ICD-10-CM | POA: Diagnosis present

## 2019-06-04 DIAGNOSIS — M25562 Pain in left knee: Secondary | ICD-10-CM | POA: Diagnosis present

## 2019-06-04 DIAGNOSIS — X58XXXA Exposure to other specified factors, initial encounter: Secondary | ICD-10-CM | POA: Diagnosis not present

## 2019-06-18 ENCOUNTER — Other Ambulatory Visit: Payer: Self-pay | Admitting: Surgery

## 2019-06-29 ENCOUNTER — Ambulatory Visit
Admission: RE | Admit: 2019-06-29 | Discharge: 2019-06-29 | Disposition: A | Discharge status: Home / Self Care | Payer: BC Managed Care – PPO | Source: Ambulatory Visit | Attending: Physician Assistant | Admitting: Physician Assistant

## 2019-06-29 DIAGNOSIS — Z1231 Encounter for screening mammogram for malignant neoplasm of breast: Secondary | ICD-10-CM | POA: Insufficient documentation

## 2019-06-30 ENCOUNTER — Other Ambulatory Visit: Payer: Self-pay

## 2019-06-30 ENCOUNTER — Other Ambulatory Visit: Payer: BC Managed Care – PPO

## 2019-06-30 ENCOUNTER — Encounter
Admission: RE | Admit: 2019-06-30 | Discharge: 2019-06-30 | Disposition: A | Discharge status: Home / Self Care | Payer: BC Managed Care – PPO | Source: Ambulatory Visit | Attending: Surgery | Admitting: Surgery

## 2019-06-30 DIAGNOSIS — Z20822 Contact with and (suspected) exposure to covid-19: Secondary | ICD-10-CM | POA: Diagnosis not present

## 2019-06-30 DIAGNOSIS — Z01812 Encounter for preprocedural laboratory examination: Secondary | ICD-10-CM | POA: Diagnosis present

## 2019-06-30 HISTORY — DX: Gastro-esophageal reflux disease without esophagitis: K21.9

## 2019-06-30 NOTE — Patient Instructions (Signed)
Your procedure is scheduled on: Thursday 07/09/19.  Report to DAY SURGERY DEPARTMENT LOCATED ON 2ND FLOOR MEDICAL MALL ENTRANCE. To find out your arrival time please call [phone] between 1PM - 3PM on Wednesday 07/08/19.   Remember: Instructions that are not followed completely may result in serious medical risk, up to and including death, or upon the discretion of your surgeon and anesthesiologist your surgery may need to be rescheduled.      _X__ 1. Do not eat food after midnight the night before your procedure.                 No gum chewing or hard candies. You may drink clear liquids up to 2 hours                 before you are scheduled to arrive for your surgery- DO NOT drink clear                 liquids within 2 hours of the start of your surgery.                 Clear Liquids include:  water, apple juice without pulp, clear carbohydrate                 drink such as Clearfast or Gatorade, Black Coffee or Tea (Do not add                 anything to coffee or tea).   ** Dr. Roland Rack would like for you to finish the Clear Pre-Surgery Ensure on the morning of your surgery 2 hours before your arrival time **    __X__2.  On the morning of surgery brush your teeth with toothpaste and water, you may rinse your mouth with mouthwash if you wish.  Do not swallow any toothpaste or mouthwash.      _X__ 3.  No Alcohol for 24 hours before or after surgery.    _X__ 4.  Do Not Smoke or use e-cigarettes For 24 Hours Prior to Your Surgery.                 Do not use any chewable tobacco products for at least 6 hours prior to                 Surgery.   __X__5.  Notify your doctor if there is any change in your medical condition      (cold, fever, infections).      Do not wear jewelry, make-up, hairpins, clips or nail polish. Do not wear lotions, powders, or perfumes.  Do not shave 48 hours prior to surgery. Men may shave face and neck. Do not bring valuables to the hospital.      [facility] is not responsible for any belongings or valuables.   Contacts, dentures/partials or body piercings may not be worn into surgery. Bring a case for your contacts, glasses or hearing aids, a denture cup will be supplied.   Patients discharged the day of surgery will not be allowed to drive home.    __X__ Take these medicines the morning of surgery with A SIP OF WATER:     1. EUTHYROX   2. hydroxychloroquine (PLAQUENIL)   3. omeprazole (PRILOSEC)      __X__ Use CHG Soap/SAGE wipes as directed    __X__ Stop Anti-inflammatories 7 days before surgery such as Advil, Ibuprofen, Motrin, BC or Goodies Powder, Naprosyn, Naproxen, Aleve, Aspirin, Meloxicam. May take Tylenol if needed  for pain or discomfort.  STOP TAKING diclofenac (VOLTAREN) ON 07/02/19.    __X__ Don't begin taking any new herbal supplements or vitamins prior to your procedure.

## 2019-07-01 ENCOUNTER — Encounter
Admission: RE | Admit: 2019-07-01 | Discharge: 2019-07-01 | Disposition: A | Discharge status: Home / Self Care | Payer: BC Managed Care – PPO | Source: Ambulatory Visit | Attending: Surgery | Admitting: Surgery

## 2019-07-01 DIAGNOSIS — R9431 Abnormal electrocardiogram [ECG] [EKG]: Secondary | ICD-10-CM | POA: Insufficient documentation

## 2019-07-01 DIAGNOSIS — Z01818 Encounter for other preprocedural examination: Secondary | ICD-10-CM | POA: Insufficient documentation

## 2019-07-01 LAB — URINALYSIS, ROUTINE W REFLEX MICROSCOPIC
Bilirubin Urine: NEGATIVE
Glucose, UA: NEGATIVE mg/dL
Hgb urine dipstick: NEGATIVE
Ketones, ur: NEGATIVE mg/dL
Nitrite: NEGATIVE
Protein, ur: NEGATIVE mg/dL
Specific Gravity, Urine: 1.009 (ref 1.005–1.030)
pH: 6 (ref 5.0–8.0)

## 2019-07-01 LAB — COMPREHENSIVE METABOLIC PANEL
ALT: 21 U/L (ref 0–44)
AST: 17 U/L (ref 15–41)
Albumin: 4.3 g/dL (ref 3.5–5.0)
Alkaline Phosphatase: 71 U/L (ref 38–126)
Anion gap: 10 (ref 5–15)
BUN: 21 mg/dL (ref 8–23)
CO2: 26 mmol/L (ref 22–32)
Calcium: 9 mg/dL (ref 8.9–10.3)
Chloride: 102 mmol/L (ref 98–111)
Creatinine, Ser: 0.85 mg/dL (ref 0.44–1.00)
GFR calc Af Amer: >60 mL/min (ref >60)
GFR calc non Af Amer: >60 mL/min (ref >60)
Glucose, Bld: 94 mg/dL (ref 70–99)
Potassium: 3.9 mmol/L (ref 3.5–5.1)
Sodium: 138 mmol/L (ref 135–145)
Total Bilirubin: 0.9 mg/dL (ref 0.3–1.2)
Total Protein: 7.4 g/dL (ref 6.5–8.1)

## 2019-07-01 LAB — CBC WITH DIFFERENTIAL/PLATELET
Abs Immature Granulocytes: 0.02 10*3/uL (ref 0.00–0.07)
Basophils Absolute: 0.1 10*3/uL (ref 0.0–0.1)
Basophils Relative: 1 %
Eosinophils Absolute: 0.2 10*3/uL (ref 0.0–0.5)
Eosinophils Relative: 3 %
HCT: 39.6 % (ref 36.0–46.0)
Hemoglobin: 12.9 g/dL (ref 12.0–15.0)
Immature Granulocytes: 0 %
Lymphocytes Relative: 20 %
Lymphs Abs: 1.2 10*3/uL (ref 0.7–4.0)
MCH: 29.8 pg (ref 26.0–34.0)
MCHC: 32.6 g/dL (ref 30.0–36.0)
MCV: 91.5 fL (ref 80.0–100.0)
Monocytes Absolute: 0.5 10*3/uL (ref 0.1–1.0)
Monocytes Relative: 9 %
Neutro Abs: 3.9 10*3/uL (ref 1.7–7.7)
Neutrophils Relative %: 67 %
Platelets: 334 10*3/uL (ref 150–400)
RBC: 4.33 MIL/uL (ref 3.87–5.11)
RDW: 13.5 % (ref 11.5–15.5)
WBC: 5.9 10*3/uL (ref 4.0–10.5)
nRBC: 0 % (ref 0.0–0.2)

## 2019-07-01 LAB — SURGICAL PCR SCREEN
MRSA, PCR: NEGATIVE
Staphylococcus aureus: NEGATIVE

## 2019-07-01 NOTE — Pre-Procedure Instructions (Signed)
Secure chat msg sent to DR Poggi "labs availble for review".

## 2019-07-07 ENCOUNTER — Other Ambulatory Visit: Payer: Self-pay

## 2019-07-07 ENCOUNTER — Other Ambulatory Visit
Admission: RE | Admit: 2019-07-07 | Discharge: 2019-07-07 | Disposition: A | Discharge status: Home / Self Care | Payer: BC Managed Care – PPO | Source: Ambulatory Visit | Attending: Surgery | Admitting: Surgery

## 2019-07-07 DIAGNOSIS — Z20822 Contact with and (suspected) exposure to covid-19: Secondary | ICD-10-CM | POA: Insufficient documentation

## 2019-07-07 DIAGNOSIS — Z01812 Encounter for preprocedural laboratory examination: Secondary | ICD-10-CM | POA: Insufficient documentation

## 2019-07-07 LAB — SARS CORONAVIRUS 2 (TAT 6-24 HRS): SARS Coronavirus 2: NEGATIVE

## 2019-07-07 NOTE — TOC Progression Note (Signed)
Transition of Care [facility]) - Progression Note    Patient Details  Name: HANIFAH SCHIRRA MRN: [id number] Date of Birth: [birth year]  Transition of [facility]) CM/SW [place], RN Phone Number: 07/07/2019, 3:14 PM  Clinical Narrative:     Requested the price of Lovenox will notify the patient once obtained       Expected Discharge Plan and Services                                                 Social Determinants of Health (SDOH) Interventions    Readmission Risk Interventions No flowsheet data found.

## 2019-07-08 NOTE — TOC Benefit Eligibility Note (Addendum)
Transition of Care [facility]) Benefit Eligibility Note    Patient Details  Name: Alejandra Wilson MRN: [id number] Date of Birth: 12/03/1955      Covered?: No        Spoke with Person/Company/Phone Number:: [employer]- [phone]   A message was left for patient to return call regarding prescription coverage plan          Additional Notes: No known active insurance at this time    Tommy Medal Phone Number: 07/08/2019, 3:21 PM

## 2019-07-09 ENCOUNTER — Ambulatory Visit
Admission: RE | Admit: 2019-07-09 | Discharge: 2019-07-09 | Disposition: A | Discharge status: Home / Self Care | Payer: BC Managed Care – PPO | Attending: Surgery | Admitting: Surgery

## 2019-07-09 ENCOUNTER — Inpatient Hospital Stay: Payer: BC Managed Care – PPO

## 2019-07-09 ENCOUNTER — Other Ambulatory Visit: Payer: Self-pay

## 2019-07-09 ENCOUNTER — Inpatient Hospital Stay: Payer: BC Managed Care – PPO | Admitting: Certified Registered Nurse Anesthetist

## 2019-07-09 ENCOUNTER — Encounter: Payer: Self-pay | Admitting: Surgery

## 2019-07-09 ENCOUNTER — Encounter
Admission: RE | Disposition: A | Discharge status: Home / Self Care | Payer: Self-pay | Source: Home / Self Care | Attending: Surgery

## 2019-07-09 DIAGNOSIS — Z833 Family history of diabetes mellitus: Secondary | ICD-10-CM | POA: Insufficient documentation

## 2019-07-09 DIAGNOSIS — I10 Essential (primary) hypertension: Secondary | ICD-10-CM | POA: Diagnosis not present

## 2019-07-09 DIAGNOSIS — K219 Gastro-esophageal reflux disease without esophagitis: Secondary | ICD-10-CM | POA: Diagnosis not present

## 2019-07-09 DIAGNOSIS — Z885 Allergy status to narcotic agent status: Secondary | ICD-10-CM | POA: Insufficient documentation

## 2019-07-09 DIAGNOSIS — E785 Hyperlipidemia, unspecified: Secondary | ICD-10-CM | POA: Insufficient documentation

## 2019-07-09 DIAGNOSIS — M069 Rheumatoid arthritis, unspecified: Secondary | ICD-10-CM | POA: Diagnosis not present

## 2019-07-09 DIAGNOSIS — Z8249 Family history of ischemic heart disease and other diseases of the circulatory system: Secondary | ICD-10-CM | POA: Diagnosis not present

## 2019-07-09 DIAGNOSIS — M1712 Unilateral primary osteoarthritis, left knee: Secondary | ICD-10-CM | POA: Diagnosis present

## 2019-07-09 DIAGNOSIS — Z87891 Personal history of nicotine dependence: Secondary | ICD-10-CM | POA: Insufficient documentation

## 2019-07-09 DIAGNOSIS — Z96652 Presence of left artificial knee joint: Secondary | ICD-10-CM

## 2019-07-09 DIAGNOSIS — E039 Hypothyroidism, unspecified: Secondary | ICD-10-CM | POA: Diagnosis not present

## 2019-07-09 DIAGNOSIS — Z79899 Other long term (current) drug therapy: Secondary | ICD-10-CM | POA: Insufficient documentation

## 2019-07-09 HISTORY — PX: PARTIAL KNEE ARTHROPLASTY: SHX2174

## 2019-07-09 SURGERY — ARTHROPLASTY, KNEE, UNICOMPARTMENTAL
Anesthesia: General | Site: Knee | Laterality: Left

## 2019-07-09 MED ORDER — PHENYLEPHRINE HCL (PRESSORS) 10 MG/ML IV SOLN
INTRAVENOUS | Status: DC | PRN
  Administered 2019-07-09 (×2): 100 ug via INTRAVENOUS

## 2019-07-09 MED ORDER — APIXABAN 2.5 MG PO TABS: 2.5000 mg | ORAL_TABLET | Freq: Two times a day (BID) | ORAL | 0 refills | Status: DC

## 2019-07-09 MED ORDER — KETOROLAC TROMETHAMINE 15 MG/ML IJ SOLN: 15.0000 mg | Freq: Four times a day (QID) | INTRAMUSCULAR | Status: DC

## 2019-07-09 MED ORDER — TRANEXAMIC ACID 1000 MG/10ML IV SOLN
INTRAVENOUS | Status: DC | PRN
  Administered 2019-07-09: 1000 mg via TOPICAL

## 2019-07-09 MED ORDER — FENTANYL CITRATE (PF) 100 MCG/2ML IJ SOLN
INTRAMUSCULAR | Status: DC | PRN
  Administered 2019-07-09 (×2): 50 ug via INTRAVENOUS
  Administered 2019-07-09 (×2): 25 ug via INTRAVENOUS
  Administered 2019-07-09: 50 ug via INTRAVENOUS

## 2019-07-09 MED ORDER — CEFAZOLIN SODIUM-DEXTROSE 2-4 GM/100ML-% IV SOLN
INTRAVENOUS | Status: AC
  Filled 2019-07-09: qty 100

## 2019-07-09 MED ORDER — CEFAZOLIN SODIUM-DEXTROSE 2-4 GM/100ML-% IV SOLN
2.0000 g | Freq: Four times a day (QID) | INTRAVENOUS | Status: DC
  Administered 2019-07-09: 2 g via INTRAVENOUS

## 2019-07-09 MED ORDER — MIDAZOLAM HCL 2 MG/2ML IJ SOLN
INTRAMUSCULAR | Status: DC | PRN
  Administered 2019-07-09: 2 mg via INTRAVENOUS

## 2019-07-09 MED ORDER — SODIUM CHLORIDE 0.9 % BOLUS PEDS
250.0000 mL | Freq: Once | INTRAVENOUS | Status: AC
  Administered 2019-07-09: 250 mL via INTRAVENOUS

## 2019-07-09 MED ORDER — SODIUM CHLORIDE FLUSH 0.9 % IV SOLN
INTRAVENOUS | Status: AC
  Filled 2019-07-09: qty 40

## 2019-07-09 MED ORDER — LIDOCAINE HCL (PF) 2 % IJ SOLN
INTRAMUSCULAR | Status: AC
  Filled 2019-07-09: qty 10

## 2019-07-09 MED ORDER — POTASSIUM CHLORIDE IN NACL 20-0.9 MEQ/L-% IV SOLN
INTRAVENOUS | Status: DC
  Filled 2019-07-09: qty 1000

## 2019-07-09 MED ORDER — METOCLOPRAMIDE HCL 10 MG PO TABS: 5.0000 mg | ORAL_TABLET | Freq: Three times a day (TID) | ORAL | Status: DC | PRN

## 2019-07-09 MED ORDER — OXYCODONE HCL 5 MG PO TABS
ORAL_TABLET | ORAL | Status: AC
  Filled 2019-07-09: qty 1

## 2019-07-09 MED ORDER — CHLORHEXIDINE GLUCONATE 4 % EX LIQD
60.0000 mL | Freq: Once | CUTANEOUS | Status: AC
  Administered 2019-07-09: 4 via TOPICAL

## 2019-07-09 MED ORDER — BUPIVACAINE HCL (PF) 0.5 % IJ SOLN
INTRAMUSCULAR | Status: AC
  Filled 2019-07-09: qty 30

## 2019-07-09 MED ORDER — OXYCODONE HCL 5 MG PO TABS: 5.0000 mg | ORAL_TABLET | ORAL | 0 refills | Status: DC | PRN

## 2019-07-09 MED ORDER — ONDANSETRON HCL 4 MG PO TABS: 4.0000 mg | ORAL_TABLET | Freq: Four times a day (QID) | ORAL | Status: DC | PRN

## 2019-07-09 MED ORDER — ONDANSETRON HCL 4 MG/2ML IJ SOLN
INTRAMUSCULAR | Status: AC
  Filled 2019-07-09: qty 2

## 2019-07-09 MED ORDER — MIDAZOLAM HCL 2 MG/2ML IJ SOLN
INTRAMUSCULAR | Status: AC
  Filled 2019-07-09: qty 2

## 2019-07-09 MED ORDER — ACETAMINOPHEN 500 MG PO TABS: 1000.0000 mg | ORAL_TABLET | Freq: Four times a day (QID) | ORAL | Status: DC

## 2019-07-09 MED ORDER — ROCURONIUM BROMIDE 100 MG/10ML IV SOLN
INTRAVENOUS | Status: DC | PRN
  Administered 2019-07-09: 20 mg via INTRAVENOUS
  Administered 2019-07-09: 40 mg via INTRAVENOUS

## 2019-07-09 MED ORDER — FENTANYL CITRATE (PF) 100 MCG/2ML IJ SOLN
25.0000 ug | INTRAMUSCULAR | Status: DC | PRN
  Administered 2019-07-09 (×2): 25 ug via INTRAVENOUS

## 2019-07-09 MED ORDER — PROMETHAZINE HCL 25 MG/ML IJ SOLN: 6.2500 mg | INTRAMUSCULAR | Status: DC | PRN

## 2019-07-09 MED ORDER — PHENYLEPHRINE HCL (PRESSORS) 10 MG/ML IV SOLN
INTRAVENOUS | Status: AC
  Filled 2019-07-09: qty 1

## 2019-07-09 MED ORDER — KETOROLAC TROMETHAMINE 30 MG/ML IJ SOLN
30.0000 mg | Freq: Once | INTRAMUSCULAR | Status: AC | PRN
  Administered 2019-07-09: 30 mg via INTRAVENOUS

## 2019-07-09 MED ORDER — DEXAMETHASONE SODIUM PHOSPHATE 10 MG/ML IJ SOLN
INTRAMUSCULAR | Status: DC | PRN
  Administered 2019-07-09: 10 mg via INTRAVENOUS

## 2019-07-09 MED ORDER — OXYCODONE HCL 5 MG PO TABS
5.0000 mg | ORAL_TABLET | ORAL | Status: DC | PRN
  Administered 2019-07-09: 5 mg via ORAL
  Filled 2019-07-09 (×2): qty 2

## 2019-07-09 MED ORDER — SUGAMMADEX SODIUM 200 MG/2ML IV SOLN
INTRAVENOUS | Status: DC | PRN
  Administered 2019-07-09: 200 mg via INTRAVENOUS

## 2019-07-09 MED ORDER — FENTANYL CITRATE (PF) 100 MCG/2ML IJ SOLN
INTRAMUSCULAR | Status: AC
  Filled 2019-07-09: qty 2

## 2019-07-09 MED ORDER — ACETAMINOPHEN 10 MG/ML IV SOLN
INTRAVENOUS | Status: DC | PRN
  Administered 2019-07-09: 1000 mg via INTRAVENOUS

## 2019-07-09 MED ORDER — BUPIVACAINE LIPOSOME 1.3 % IJ SUSP
INTRAMUSCULAR | Status: DC | PRN
  Administered 2019-07-09: 20 mL

## 2019-07-09 MED ORDER — LIDOCAINE HCL (CARDIAC) PF 100 MG/5ML IV SOSY
PREFILLED_SYRINGE | INTRAVENOUS | Status: DC | PRN
  Administered 2019-07-09: 60 mg via INTRAVENOUS

## 2019-07-09 MED ORDER — PROPOFOL 10 MG/ML IV BOLUS
INTRAVENOUS | Status: DC | PRN
  Administered 2019-07-09: 150 mg via INTRAVENOUS

## 2019-07-09 MED ORDER — ONDANSETRON HCL 4 MG/2ML IJ SOLN: 4.0000 mg | Freq: Four times a day (QID) | INTRAMUSCULAR | Status: DC | PRN

## 2019-07-09 MED ORDER — KETOROLAC TROMETHAMINE 30 MG/ML IJ SOLN: 30.0000 mg | Freq: Once | INTRAMUSCULAR | Status: DC

## 2019-07-09 MED ORDER — ACETAMINOPHEN 325 MG PO TABS: 325.0000 mg | ORAL_TABLET | ORAL | Status: DC | PRN

## 2019-07-09 MED ORDER — ROCURONIUM BROMIDE 10 MG/ML (PF) SYRINGE
PREFILLED_SYRINGE | INTRAVENOUS | Status: AC
  Filled 2019-07-09: qty 10

## 2019-07-09 MED ORDER — KETOROLAC TROMETHAMINE 30 MG/ML IJ SOLN
INTRAMUSCULAR | Status: AC
  Filled 2019-07-09: qty 1

## 2019-07-09 MED ORDER — EPINEPHRINE PF 1 MG/ML IJ SOLN
INTRAMUSCULAR | Status: AC
  Filled 2019-07-09: qty 1

## 2019-07-09 MED ORDER — ONDANSETRON HCL 4 MG/2ML IJ SOLN
INTRAMUSCULAR | Status: DC | PRN
  Administered 2019-07-09: 4 mg via INTRAVENOUS

## 2019-07-09 MED ORDER — ACETAMINOPHEN 10 MG/ML IV SOLN
INTRAVENOUS | Status: AC
  Filled 2019-07-09: qty 100

## 2019-07-09 MED ORDER — FENTANYL CITRATE (PF) 100 MCG/2ML IJ SOLN
INTRAMUSCULAR | Status: AC
  Administered 2019-07-09: 25 ug via INTRAVENOUS
  Filled 2019-07-09: qty 2

## 2019-07-09 MED ORDER — CEFAZOLIN SODIUM-DEXTROSE 2-4 GM/100ML-% IV SOLN
2.0000 g | INTRAVENOUS | Status: AC
  Administered 2019-07-09: 08:00:00 2 g via INTRAVENOUS

## 2019-07-09 MED ORDER — ONDANSETRON 4 MG PO TBDP: 4.0000 mg | ORAL_TABLET | Freq: Three times a day (TID) | ORAL | 1 refills | Status: DC | PRN

## 2019-07-09 MED ORDER — BUPIVACAINE-EPINEPHRINE (PF) 0.5% -1:200000 IJ SOLN
INTRAMUSCULAR | Status: DC | PRN
  Administered 2019-07-09: 30 mL via PERINEURAL

## 2019-07-09 MED ORDER — BUPIVACAINE LIPOSOME 1.3 % IJ SUSP
INTRAMUSCULAR | Status: AC
  Filled 2019-07-09: qty 20

## 2019-07-09 MED ORDER — METOCLOPRAMIDE HCL 5 MG/ML IJ SOLN: 5.0000 mg | Freq: Three times a day (TID) | INTRAMUSCULAR | Status: DC | PRN

## 2019-07-09 MED ORDER — TRANEXAMIC ACID 1000 MG/10ML IV SOLN
INTRAVENOUS | Status: AC
  Filled 2019-07-09: qty 10

## 2019-07-09 MED ORDER — DEXAMETHASONE SODIUM PHOSPHATE 10 MG/ML IJ SOLN
INTRAMUSCULAR | Status: AC
  Filled 2019-07-09: qty 1

## 2019-07-09 MED ORDER — ACETAMINOPHEN 160 MG/5ML PO SOLN
325.0000 mg | ORAL | Status: DC | PRN
  Filled 2019-07-09: qty 20.3

## 2019-07-09 MED ORDER — PROPOFOL 10 MG/ML IV BOLUS
INTRAVENOUS | Status: AC
  Filled 2019-07-09: qty 20

## 2019-07-09 MED ORDER — LACTATED RINGERS IV SOLN: INTRAVENOUS | Status: DC

## 2019-07-09 SURGICAL SUPPLY — 65 items
BEARING TIB MENIS OX UNI LT 4 (Knees) ×1 IMPLANT
BNDG ELASTIC 6X5.8 VLCR STR LF (GAUZE/BANDAGES/DRESSINGS) ×2 IMPLANT
CANISTER SUCT 1200ML W/VALVE (MISCELLANEOUS) ×2 IMPLANT
CANISTER SUCT 3000ML PPV (MISCELLANEOUS) ×2 IMPLANT
CEMENT BONE R 1X40 (Cement) ×2 IMPLANT
CEMENT VACUUM MIXING SYSTEM (MISCELLANEOUS) ×2 IMPLANT
CHLORAPREP W/TINT 26 (MISCELLANEOUS) ×2 IMPLANT
COOLER POLAR GLACIER W/PUMP (MISCELLANEOUS) ×2 IMPLANT
COVER MAYO STAND REUSABLE (DRAPES) ×2 IMPLANT
COVER WAND RF STERILE (DRAPES) ×2 IMPLANT
CUFF TOURN SGL QUICK 24 (TOURNIQUET CUFF)
CUFF TOURN SGL QUICK 30 (TOURNIQUET CUFF) ×1
CUFF TRNQT CYL 24X4X16.5-23 (TOURNIQUET CUFF) IMPLANT
CUFF TRNQT CYL 30X4X21-28X (TOURNIQUET CUFF) ×1 IMPLANT
DRAPE C-ARM XRAY 36X54 (DRAPES) ×2 IMPLANT
DRSG OPSITE POSTOP 4X12 (GAUZE/BANDAGES/DRESSINGS) ×2 IMPLANT
DRSG OPSITE POSTOP 4X14 (GAUZE/BANDAGES/DRESSINGS) IMPLANT
DRSG OPSITE POSTOP 4X6 (GAUZE/BANDAGES/DRESSINGS) ×2 IMPLANT
ELECT CAUTERY BLADE 6.4 (BLADE) ×2 IMPLANT
ELECT REM PT RETURN 9FT ADLT (ELECTROSURGICAL) ×2
ELECTRODE REM PT RTRN 9FT ADLT (ELECTROSURGICAL) ×1 IMPLANT
GAUZE 4X4 16PLY RFD (DISPOSABLE) IMPLANT
GAUZE SPONGE 4X4 12PLY STRL (GAUZE/BANDAGES/DRESSINGS) IMPLANT
GAUZE XEROFORM 1X8 LF (GAUZE/BANDAGES/DRESSINGS) ×2 IMPLANT
GLOVE BIO SURGEON STRL SZ7.5 (GLOVE) ×8 IMPLANT
GLOVE BIO SURGEON STRL SZ8 (GLOVE) ×8 IMPLANT
GLOVE BIOGEL PI IND STRL 8 (GLOVE) ×1 IMPLANT
GLOVE BIOGEL PI INDICATOR 8 (GLOVE) ×1
GLOVE INDICATOR 8.0 STRL GRN (GLOVE) ×2 IMPLANT
GOWN STRL REUS W/ TWL LRG LVL3 (GOWN DISPOSABLE) ×1 IMPLANT
GOWN STRL REUS W/ TWL XL LVL3 (GOWN DISPOSABLE) ×1 IMPLANT
GOWN STRL REUS W/TWL LRG LVL3 (GOWN DISPOSABLE) ×1
GOWN STRL REUS W/TWL XL LVL3 (GOWN DISPOSABLE) ×1
HOLDER FOLEY CATH W/STRAP (MISCELLANEOUS) IMPLANT
HOOD PEEL AWAY FLYTE STAYCOOL (MISCELLANEOUS) ×10 IMPLANT
INSERT TIBIAL OXFORD SZ B LF (Joint) ×2 IMPLANT
KIT TURNOVER KIT A (KITS) ×2 IMPLANT
KNEE PARTIAL CEMENT FEM XS (Miscellaneous) ×2 IMPLANT
MAT ABSORB  FLUID 56X50 GRAY (MISCELLANEOUS) ×1
MAT ABSORB FLUID 56X50 GRAY (MISCELLANEOUS) ×1 IMPLANT
MENISCAL OX UNI EXSM LT 4THICK (Knees) ×2 IMPLANT
NDL SAFETY ECLIPSE 18X1.5 (NEEDLE) ×1 IMPLANT
NEEDLE HYPO 18GX1.5 SHARP (NEEDLE) ×1
NEEDLE SPNL 20GX3.5 QUINCKE YW (NEEDLE) ×2 IMPLANT
NS IRRIG 1000ML POUR BTL (IV SOLUTION) ×2 IMPLANT
PACK BLADE SAW RECIP 70 3 PT (BLADE) ×2 IMPLANT
PACK TOTAL KNEE (MISCELLANEOUS) ×2 IMPLANT
PAD WRAPON POLAR KNEE (MISCELLANEOUS) ×1 IMPLANT
PULSAVAC PLUS IRRIG FAN TIP (DISPOSABLE) ×2
SOL .9 NS 3000ML IRR  AL (IV SOLUTION) ×1
SOL .9 NS 3000ML IRR UROMATIC (IV SOLUTION) ×1 IMPLANT
STAPLER SKIN PROX 35W (STAPLE) ×2 IMPLANT
STRAP SAFETY 5IN WIDE (MISCELLANEOUS) ×2 IMPLANT
SUCTION FRAZIER HANDLE 10FR (MISCELLANEOUS) ×1
SUCTION TUBE FRAZIER 10FR DISP (MISCELLANEOUS) ×1 IMPLANT
SUT VIC AB 0 CT1 36 (SUTURE) ×2 IMPLANT
SUT VIC AB 2-0 CT1 27 (SUTURE) ×4
SUT VIC AB 2-0 CT1 TAPERPNT 27 (SUTURE) ×4 IMPLANT
SYR 10ML LL (SYRINGE) ×2 IMPLANT
SYR 20ML LL LF (SYRINGE) ×2 IMPLANT
SYR 30ML LL (SYRINGE) ×6 IMPLANT
TAPE TRANSPORE STRL 2 31045 (GAUZE/BANDAGES/DRESSINGS) ×2 IMPLANT
TIP FAN IRRIG PULSAVAC PLUS (DISPOSABLE) ×1 IMPLANT
TRAY FOLEY MTR SLVR 16FR STAT (SET/KITS/TRAYS/PACK) ×2 IMPLANT
WRAPON POLAR PAD KNEE (MISCELLANEOUS) ×2

## 2019-07-09 NOTE — Progress Notes (Signed)
Patient educated and demonstrated Chiropodist with a goal of 2000 ml.

## 2019-07-09 NOTE — H&P (Signed)
Paper H&P to be scanned into permanent record. H&P reviewed and patient re-examined. No changes. 

## 2019-07-09 NOTE — Transfer of Care (Signed)
Immediate Anesthesia Transfer of Care Note  Patient: Alejandra Wilson  Procedure(s) Performed: UNICOMPARTMENTAL KNEE (Left Knee)  Patient Location: PACU  Anesthesia Type:General  Level of Consciousness: awake, alert  and oriented  Airway & Oxygen Therapy: Patient Spontanous Breathing and Patient connected to face mask oxygen  Post-op Assessment: Report given to RN and Post -op Vital signs reviewed and stable  Post vital signs: Reviewed and stable  Last Vitals:  Vitals Value Taken Time  BP 147/78 07/09/19 0957  Temp    Pulse 84 07/09/19 0959  Resp 19 07/09/19 0959  SpO2 100 % 07/09/19 0959  Vitals shown include unvalidated device data.  Last Pain:  Vitals:   07/09/19 0629  TempSrc: Tympanic  PainSc: 0-No pain         Complications: No apparent anesthesia complications

## 2019-07-09 NOTE — Anesthesia Procedure Notes (Signed)
Procedure Name: Intubation Date/Time: 07/09/2019 7:37 AM Performed by: Caryl Asp, CRNA Pre-anesthesia Checklist: Patient identified, Patient being monitored, Timeout performed, Emergency Drugs available and Suction available Patient Re-evaluated:Patient Re-evaluated prior to induction Oxygen Delivery Method: Circle system utilized Preoxygenation: Pre-oxygenation with 100% oxygen Induction Type: IV induction Ventilation: Mask ventilation without difficulty and Oral airway inserted - appropriate to patient size Laryngoscope Size: 3 and McGraph Grade View: Grade I Tube type: Oral Tube size: 7.0 mm Number of attempts: 1 Airway Equipment and Method: Stylet and Video-laryngoscopy Placement Confirmation: ETT inserted through vocal cords under direct vision,  positive ETCO2 and breath sounds checked- equal and bilateral Secured at: 21 cm Tube secured with: Tape Dental Injury: Teeth and Oropharynx as per pre-operative assessment

## 2019-07-09 NOTE — Discharge Instructions (Addendum)
Orthopedic discharge instructions: May shower with intact OpSite dressing.  Apply ice frequently to knee or use Polar Care. Take oxycodone as prescribed when needed.  May supplement with ES Tylenol if necessary. Start Eliquis 2.5 mg twice daily as of tomorrow morning. May weight-bear as tolerated on left leg- use crutches or walker as needed. Follow-up in 10-14 days or as scheduled.    AMBULATORY SURGERY  DISCHARGE INSTRUCTIONS   1) The drugs that you were given will stay in your system until tomorrow so for the next 24 hours you should not:  A) Drive an automobile B) Make any legal decisions C) Drink any alcoholic beverage   2) You may resume regular meals tomorrow.  Today it is better to start with liquids and gradually work up to solid foods.  You may eat anything you prefer, but it is better to start with liquids, then soup and crackers, and gradually work up to solid foods.   3) Please notify your doctor immediately if you have any unusual bleeding, trouble breathing, redness and pain at the surgery site, drainage, fever, or pain not relieved by medication.    4) Additional Instructions:        Please contact your physician with any problems or Same Day Surgery at [phone], Monday through Friday 6 am to 4 pm, or [place] at [facility] [facility] number at [phone].

## 2019-07-09 NOTE — OR Nursing (Signed)
PT advises pt ok for discharge to home.

## 2019-07-09 NOTE — OR Nursing (Signed)
PT in for evaluation 1440.

## 2019-07-09 NOTE — Op Note (Signed)
07/09/2019  9:40 AM  Patient:   Alejandra Wilson  Pre-Op Diagnosis:   Osteoarthritis of medial compartment, left knee.  Post-Op Diagnosis:   Same  Procedure:   Left unicondylar knee arthroplasty.  Surgeon:   Pascal Lux, MD  Assistant:   Cameron Proud, PA-C; Mat Carne, PA-S  Anesthesia:   GET  Findings:   As above.  Complications:   None  EBL:   10 cc  Fluids:   700 cc crystalloid  UOP:   None  TT:   85 minutes at 300 mmHg  Drains:   None  Closure:   Staples  Implants:   All-cemented Biomet Oxford system with an XS femoral component, a "B" sized tibial tray, and a 4 mm meniscal bearing insert.  Brief Clinical Note:   The patient is a 64 year old female with a history of progressively worsening medial sided left knee pain. Her symptoms have progressed despite medications, activity modification, etc. Her history and examination consistent with degenerative joint disease, primarily limited to the medial compartment as confirmed by an MRI scan. The patient presents at this time for a left partial knee replacement.  Procedure:   The patient was brought into the operating room and lain in the supine position. After adequate general endotracheal intubation and anesthesia were obtained, the patient was repositioned so that the non-surgical leg was placed in a flexed and abducted position in the yellow fin leg holder while the surgical extremity was placed over the Biomet leg holder. The left lower extremity was prepped with ChloraPrep solution before being draped sterilely. Preoperative antibiotics were administered. After performing a timeout to verify the appropriate surgical site, the limb was exsanguinated with an Esmarch and the tourniquet inflated to 300 mmHg.   A standard anterior approach to the knee was made through an approximately 3.5-4 inch incision. The incision was carried down through the subcutaneous tissues to expose the superficial retinaculum. This was split  the length the incision and the medial flap elevated sufficiently to expose the medial retinaculum. The medial retinaculum was incised along the medial border of the patella tendon and extended proximally along the medial border of the patella, leaving a 3-4 mm cuff of tissue. The soft tissues were elevated off the anteromedial aspect of the proximal tibia. The anterior portion of the meniscus was removed after performing a subtotal excision of the infrapatellar fat pad. The anterior cruciate ligament was inspected and found to be in excellent condition. Osteophytes were removed from the inferior pole of the patella as well as from the notch using a quarter-inch osteotome. There were significant degenerative changes of both the femur and tibia on the medial side. The medial femoral condyle was sized using the small and extra small sizers. It was felt that the extra small guide best optimized the contour of the femur. This was left in place and the external tibial guide positioned. The coupling device was used to connect the guide to the medial femoral condylar sizer to optimize appropriate orientation. Two guide pins were inserted into the cutting block before the coupling device and sizer were removed. The appropriate tibial cut was made using the oscillating and reciprocating saws. The piece was removed in its entirety and taken to the back table where it was sized and found to be optimally replicated by a "B" sized component. The 8 mm spacer was inserted to verify that sufficient bone had been removed.  Attention was directed to femoral side. The intramedullary canal was accessed through  a 4 mm drill hole. The intramedullary guide was positioned before the guide for the femoral condylar holes was positioned. The appropriate coupling device connected this guide to the intramedullary guide before both drill holes were created in the distal aspect of the medial femoral condyle. The devices were removed and the  posterior condylar cutting block inserted. The appropriate cut was made using the reciprocating saw and this piece removed. The #0 spigot was inserted and the initial bone milling performed. A trial femoral component was inserted and both the flexion and extension gaps measured. In flexion, the gap measured 7 mm whereas in extension, it measured 3 mm. Therefore, the #4 spigot was selected and the secondary bone milling performed. Repeat sizing demonstrated symmetric flexion and extension gaps. The bone was removed from the postero-medial and postero-lateral aspects of the femoral condyle, as well as from the beneath the collar of the spigot. Bone also was removed from the anterior portion of the femur so as to minimize any potential impingement with the meniscal bearing insert. The trial components removed and several drill holes placed into the distal femoral condyle to further augment cement fixation.  Attention was redirected to the tibial side. The "B" sized tibial tray was positioned and temporarily secured using the appropriate spiked nail. The keel was created using the bi-bladed reciprocating saw and hoe. The keeled "B" sized trial tibial tray was inserted to be sure that it seated properly. At this point, a total of 20 cc of Exparel diluted out to 60 cc with normal saline and 30 cc of 0.5% Sensorcaine was injected in and around the posterior and medial capsular tissues, as well as the peri-incisional tissues to help with postoperative pain control.  The bony surfaces were prepared for cementing by irrigating them thoroughly with bacitracin saline solution using the jet lavage system before packing them with a dry Ray-Tec sponge. Meanwhile, cement was being mixed on the back table. When the cement was ready, the tibial tray was cemented in first. The excess cement was removed using a Surveyor, quantity after impacting it into place. Next, the femoral component was impacted into place. Again the excess  cement was removed using a Surveyor, quantity. The 5 mm spacer was inserted and the knee brought into near full extension while the cement hardened. Once the cement hardened, the spacer was removed and the 4 mm meniscal bearing insert was trialed. This demonstrated excellent tracking while the knee was placed through a range of motion, and showed no evidence towards subluxation or dislocation. In addition, it did not fit too tightly. Therefore, the permanent 4 mm meniscal bearing insert was snapped into position after verifying that no cement had been retained posteriorly. Again the knee was placed through a range of motion with the findings as described above.  The wound was copiously irrigated with sterile saline solution via the jet lavage system before the retinacular layer was reapproximated using #0 Vicryl interrupted sutures. At this point, 1 g of transexemic acid in 10 cc of normal saline was injected intra-articularly. The subcutaneous tissues were closed in two layers using 2-0 Vicryl interrupted sutures before the skin was closed using staples. A sterile occlusive dressing was applied to the knee before the patient was awakened. The patient was transferred back to his/her hospital bed and returned to the recovery room in satisfactory condition after tolerating the procedure well. A Polar Care device was applied to the knee as well.

## 2019-07-09 NOTE — Anesthesia Preprocedure Evaluation (Signed)
Anesthesia Evaluation  Patient identified by MRN, date of birth, ID band Patient awake    Reviewed: Allergy & Precautions, H&P , NPO status , reviewed documented beta blocker date and time   Airway Mallampati: II  TM Distance: >3 FB Neck ROM: full    Dental  (+) Partial Lower, Partial Upper   Pulmonary Patient abstained from smoking., former smoker,    Pulmonary exam normal        Cardiovascular Normal cardiovascular exam     Neuro/Psych    GI/Hepatic GERD  Medicated and Controlled,  Endo/Other    Renal/GU      Musculoskeletal   Abdominal   Peds  Hematology   Anesthesia Other Findings Past Medical History: No date: GERD (gastroesophageal reflux disease) Past Surgical History: No date: OVARIAN CYST REMOVAL   Reproductive/Obstetrics                             Anesthesia Physical Anesthesia Plan  ASA: II  Anesthesia Plan: General   Post-op Pain Management:    Induction: Intravenous  PONV Risk Score and Plan: Ondansetron and Treatment may vary due to age or medical condition  Airway Management Planned: LMA and Oral ETT  Additional Equipment:   Intra-op Plan:   Post-operative Plan: Extubation in OR  Informed Consent: I have reviewed the patients History and Physical, chart, labs and discussed the procedure including the risks, benefits and alternatives for the proposed anesthesia with the patient or authorized representative who has indicated his/her understanding and acceptance.     Dental Advisory Given  Plan Discussed with: CRNA  Anesthesia Plan Comments:         Anesthesia Quick Evaluation

## 2019-07-09 NOTE — Evaluation (Signed)
Physical Therapy Evaluation Patient Details Name: Alejandra Wilson MRN: [id number] DOB: 04-18-1956 Today's Date: 07/09/2019   History of Present Illness  s/p Left unicondylar knee arthroplasty (07/09/19), WBAT.  Clinical Impression  Patient up in recliner upon arrival to session in PACU.  Pain well-controlled, rated 4/10 (medication prior to session).  Fair/good L quad control noted with open chain activities.  Demonstrates L knee strength at least 3-/5 and ROM at least 5-65 degrees, limited by pain and post-op edema/dressing.  Able to complete sit/stand, basic transfers and gait (100') with RW, cga/min assist; up/down single curb x2 reps with RW, cga/min assist. Gait performance demonstrates partially reciprocal stepping pattern with fair weight acceptance/stance time L LE; mod reliance on RW, min cuing for management (tends to pick up with turns).  Good L knee control/stability; step height/length and overall comfort/confidence in gait performance improving with distance. Educated in positioning for L LE, use of polar care, car transfer techniques, HEP (provided handout with written/pictorial descriptions), activity guidelines/progression; patient voiced understanding of all information. Patient comfortable with upcoming discharge home; no further questions/concerns at this time. Would benefit from skilled PT to address above deficits and promote optimal return to PLOF.; Recommend transition to HHPT upon discharge from acute hospitalization.     Follow Up Recommendations Home health PT    Equipment Recommendations  Rolling walker with 5" wheels;3in1 (PT)    Recommendations for Other Services       Precautions / Restrictions Precautions Precautions: Fall Restrictions Weight Bearing Restrictions: Yes LLE Weight Bearing: Weight bearing as tolerated      Mobility  Bed Mobility               General bed mobility comments: seated in recliner beginning/end of treatment  session  Transfers Overall transfer level: Needs assistance Equipment used: Rolling walker (2 wheeled) Transfers: Sit to/from Stand Sit to Stand: Min guard         General transfer comment: cuing for hand placement; broad BOS, fair WBing L LE  Ambulation/Gait Ambulation/Gait assistance: Min guard;Min assist Gait Distance (Feet): 100 Feet Assistive device: Rolling walker (2 wheeled)       General Gait Details: partially reciprocal stepping pattern with fair weight acceptance/stance time L LE; mod reliance on RW, min cuing for management (tends to pick up with turns).  Good L knee control/stability; step height/length and overall comfort/confidence in gait performance improving with distance.  Stairs Stairs: Yes Stairs assistance: Min assist;Min guard Stair Management: No rails Number of Stairs: (1 x2 reps) General stair comments: retro stepping onto platform step to simulate 'stoop' at entrance of home; completes with cga/min assist for balance, min cuing for technique.  Good L knee control and WBing in modified SLS.  Wheelchair Mobility    Modified Rankin (Stroke Patients Only)       Balance Overall balance assessment: Needs assistance Sitting-balance support: No upper extremity supported;Feet supported Sitting balance-Leahy Scale: Good     Standing balance support: Bilateral upper extremity supported Standing balance-Leahy Scale: Fair                               Pertinent Vitals/Pain Pain Assessment: Faces Faces Pain Scale: Hurts little more Pain Location: L knee Pain Descriptors / Indicators: Aching;Grimacing;Guarding Pain Intervention(s): Limited activity within patient's tolerance;Monitored during session;Premedicated before session;Repositioned    Home Living Family/patient expects to be discharged to:: Private residence Living Arrangements: Spouse/significant other;Children Available Help at Discharge: Family Type of  Home: House Home  Access: Stairs to enter Entrance Stairs-Rails: None Entrance Stairs-Number of Steps: 1 Home Layout: One level Home Equipment: None      Prior Function Level of Independence: Independent         Comments: Indep with ADLs, household and community mobilization without assist device; working full-time as Chemical engineer.  Denies fall history.     Hand Dominance        Extremity/Trunk Assessment   Upper Extremity Assessment Upper Extremity Assessment: Overall WFL for tasks assessed    Lower Extremity Assessment Lower Extremity Assessment: (L LE grossly 3-/5, limited by pain and post-op edema; ROM approx 5-65 degrees.  R LE grossly WFL)       Communication   Communication: No difficulties  Cognition Arousal/Alertness: Awake/alert Behavior During Therapy: WFL for tasks assessed/performed Overall Cognitive Status: Within Functional Limits for tasks assessed                                        General Comments      Exercises Total Joint Exercises Goniometric ROM: L knee: approx 5-65 degrees, limited by post-op pain, edema and dressing Other Exercises Other Exercises: Educated in positioning for L LE, use of polar care, car transfer techniques, HEP (provided handout with written/pictorial descriptions), activity guidelines/progression; patient voiced understanding of all information. No further questions at this time. Other Exercises: Issued RW and BSC for home use; RW adjusted to appropriate height   Assessment/Plan    PT Assessment Patient needs continued PT services  PT Problem List Decreased strength;Decreased range of motion;Decreased activity tolerance;Decreased balance;Decreased mobility;Decreased coordination;Decreased knowledge of use of DME;Decreased safety awareness;Decreased knowledge of precautions;Decreased skin integrity;Pain       PT Treatment Interventions DME instruction;Gait training;Stair training;Functional mobility  training;Therapeutic activities;Therapeutic exercise;Balance training;Patient/family education    PT Goals (Current goals can be found in the Care Plan section)  Acute Rehab PT Goals Patient Stated Goal: to go home! PT Goal Formulation: With patient Time For Goal Achievement: 07/23/19 Potential to Achieve Goals: Good    Frequency BID   Barriers to discharge        Co-evaluation               AM-PAC PT "6 Clicks" Mobility  Outcome Measure Help needed turning from your back to your side while in a flat bed without using bedrails?: None Help needed moving from lying on your back to sitting on the side of a flat bed without using bedrails?: None Help needed moving to and from a bed to a chair (including a wheelchair)?: A Little Help needed standing up from a chair using your arms (e.g., wheelchair or bedside chair)?: A Little Help needed to walk in hospital room?: A Little Help needed climbing 3-5 steps with a railing? : A Little 6 Click Score: 20    End of Session Equipment Utilized During Treatment: Gait belt Activity Tolerance: Patient tolerated treatment well Patient left: in chair;with call bell/phone within reach Nurse Communication: Mobility status PT Visit Diagnosis: Muscle weakness (generalized) (M62.81);Other abnormalities of gait and mobility (R26.89);Pain Pain - Right/Left: Left Pain - part of body: Knee    Time: 1359-1431 PT Time Calculation (min) (ACUTE ONLY): 32 min   Charges:   PT Evaluation $PT Eval Moderate Complexity: 1 Mod PT Treatments $Therapeutic Activity: 23-37 mins       Emmery Seiler H. Owens Shark, PT, DPT, NCS 07/09/19, 2:52  PM [phone]

## 2019-07-16 NOTE — Anesthesia Postprocedure Evaluation (Signed)
Anesthesia Post Note  Patient: Alejandra Wilson  Procedure(s) Performed: UNICOMPARTMENTAL KNEE (Left Knee)  Patient location during evaluation: PACU Anesthesia Type: General Level of consciousness: awake and alert Pain management: pain level controlled Vital Signs Assessment: post-procedure vital signs reviewed and stable Respiratory status: spontaneous breathing, nonlabored ventilation, respiratory function stable and patient connected to nasal cannula oxygen Cardiovascular status: blood pressure returned to baseline and stable Postop Assessment: no apparent nausea or vomiting Anesthetic complications: no     Last Vitals:  Vitals:   07/09/19 1341 07/09/19 1439  BP: (!) 142/81 (!) 143/75  Pulse: 75 83  Resp: 14 16  Temp:    SpO2: 100% 98%    Last Pain:  Vitals:   07/10/19 0849  TempSrc:   PainSc: 10-Worst pain ever                 Alphonsus Sias

## 2020-05-31 ENCOUNTER — Other Ambulatory Visit: Payer: Self-pay | Admitting: Physician Assistant

## 2020-05-31 DIAGNOSIS — Z1231 Encounter for screening mammogram for malignant neoplasm of breast: Secondary | ICD-10-CM

## 2020-06-29 ENCOUNTER — Other Ambulatory Visit: Payer: Self-pay

## 2020-06-29 ENCOUNTER — Ambulatory Visit
Admission: RE | Admit: 2020-06-29 | Discharge: 2020-06-29 | Disposition: A | Discharge status: Home / Self Care | Payer: BC Managed Care – PPO | Source: Ambulatory Visit | Attending: Physician Assistant | Admitting: Physician Assistant

## 2020-06-29 DIAGNOSIS — Z1231 Encounter for screening mammogram for malignant neoplasm of breast: Secondary | ICD-10-CM | POA: Diagnosis not present

## 2020-11-18 ENCOUNTER — Other Ambulatory Visit: Payer: Self-pay | Admitting: Surgery

## 2020-12-09 ENCOUNTER — Other Ambulatory Visit: Payer: Self-pay

## 2020-12-09 ENCOUNTER — Encounter
Admission: RE | Admit: 2020-12-09 | Discharge: 2020-12-09 | Disposition: A | Discharge status: Home / Self Care | Payer: BC Managed Care – PPO | Source: Ambulatory Visit | Attending: Surgery | Admitting: Surgery

## 2020-12-09 DIAGNOSIS — Z01818 Encounter for other preprocedural examination: Secondary | ICD-10-CM | POA: Diagnosis not present

## 2020-12-09 HISTORY — DX: Hyperlipidemia, unspecified: E78.5

## 2020-12-09 HISTORY — DX: Insomnia, unspecified: G47.00

## 2020-12-09 HISTORY — DX: Rheumatoid arthritis with rheumatoid factor of multiple sites without organ or systems involvement: M05.79

## 2020-12-09 HISTORY — DX: Unilateral primary osteoarthritis, right knee: M17.11

## 2020-12-09 HISTORY — DX: Hypothyroidism, unspecified: E03.9

## 2020-12-09 LAB — URINALYSIS, ROUTINE W REFLEX MICROSCOPIC
Bacteria, UA: NONE SEEN
Bilirubin Urine: NEGATIVE
Glucose, UA: NEGATIVE mg/dL
Ketones, ur: NEGATIVE mg/dL
Leukocytes,Ua: NEGATIVE
Nitrite: NEGATIVE
Protein, ur: NEGATIVE mg/dL
Specific Gravity, Urine: 1.005 (ref 1.005–1.030)
pH: 5 (ref 5.0–8.0)

## 2020-12-09 LAB — COMPREHENSIVE METABOLIC PANEL
ALT: 24 U/L (ref 0–44)
AST: 21 U/L (ref 15–41)
Albumin: 4.2 g/dL (ref 3.5–5.0)
Alkaline Phosphatase: 72 U/L (ref 38–126)
Anion gap: 4 — ABNORMAL LOW (ref 5–15)
BUN: 28 mg/dL — ABNORMAL HIGH (ref 8–23)
CO2: 27 mmol/L (ref 22–32)
Calcium: 8.7 mg/dL — ABNORMAL LOW (ref 8.9–10.3)
Chloride: 108 mmol/L (ref 98–111)
Creatinine, Ser: 0.77 mg/dL (ref 0.44–1.00)
GFR, Estimated: >60 mL/min (ref >60)
Glucose, Bld: 92 mg/dL (ref 70–99)
Potassium: 3.7 mmol/L (ref 3.5–5.1)
Sodium: 139 mmol/L (ref 135–145)
Total Bilirubin: 1 mg/dL (ref 0.3–1.2)
Total Protein: 6.9 g/dL (ref 6.5–8.1)

## 2020-12-09 LAB — CBC WITH DIFFERENTIAL/PLATELET
Abs Immature Granulocytes: 0.02 10*3/uL (ref 0.00–0.07)
Basophils Absolute: 0 10*3/uL (ref 0.0–0.1)
Basophils Relative: 1 %
Eosinophils Absolute: 0.1 10*3/uL (ref 0.0–0.5)
Eosinophils Relative: 2 %
HCT: 36 % (ref 36.0–46.0)
Hemoglobin: 12.2 g/dL (ref 12.0–15.0)
Immature Granulocytes: 0 %
Lymphocytes Relative: 26 %
Lymphs Abs: 1.5 10*3/uL (ref 0.7–4.0)
MCH: 29.7 pg (ref 26.0–34.0)
MCHC: 33.9 g/dL (ref 30.0–36.0)
MCV: 87.6 fL (ref 80.0–100.0)
Monocytes Absolute: 0.7 10*3/uL (ref 0.1–1.0)
Monocytes Relative: 11 %
Neutro Abs: 3.5 10*3/uL (ref 1.7–7.7)
Neutrophils Relative %: 60 %
Platelets: 306 10*3/uL (ref 150–400)
RBC: 4.11 MIL/uL (ref 3.87–5.11)
RDW: 13.1 % (ref 11.5–15.5)
WBC: 5.8 10*3/uL (ref 4.0–10.5)
nRBC: 0 % (ref 0.0–0.2)

## 2020-12-09 LAB — SURGICAL PCR SCREEN
MRSA, PCR: NEGATIVE
Staphylococcus aureus: POSITIVE — AB

## 2020-12-09 NOTE — Patient Instructions (Addendum)
Your procedure is scheduled on: Tuesday, August 30 Report to the Registration Desk on the 1st floor of the [facility]. To find out your arrival time, please call [phone] between 1PM - 3PM on: Monday, August 29  REMEMBER: Instructions that are not followed completely may result in serious medical risk, up to and including death; or upon the discretion of your surgeon and anesthesiologist your surgery may need to be rescheduled.  Do not eat food after midnight the night before surgery.  No gum chewing, lozengers or hard candies.  You may however, drink CLEAR liquids up to 2 hours before you are scheduled to arrive for your surgery. Do not drink anything within 2 hours of your scheduled arrival time.  Clear liquids include: - water  - apple juice without pulp - gatorade (not RED, PURPLE, OR BLUE) - black coffee or tea (Do NOT add milk or creamers to the coffee or tea) Do NOT drink anything that is not on this list.  In addition, your doctor has ordered for you to drink the provided  Ensure Pre-Surgery Clear Carbohydrate Drink  Drinking this carbohydrate drink up to two hours before surgery helps to reduce insulin resistance and improve patient outcomes. Please complete drinking 2 hours prior to scheduled arrival time.  TAKE THESE MEDICATIONS THE MORNING OF SURGERY WITH A SIP OF WATER:  Euthyrox Omeprazole (Prilosec) - (take one the night before and one on the morning of surgery - helps to prevent nausea after surgery.) Rinvoq  One week prior to surgery: starting August 23 Stop DICLOFENAC, and Anti-inflammatories (NSAIDS) such as Advil, Aleve, Ibuprofen, Motrin, Naproxen, Naprosyn and Aspirin based products such as Excedrin, Goodys Powder, BC Powder. Stop ANY OVER THE COUNTER supplements until after surgery. You may however, continue to take Tylenol if needed for pain up until the day of surgery.  No Alcohol for 24 hours before or after surgery.  No Smoking including  e-cigarettes for 24 hours prior to surgery.  No chewable tobacco products for at least 6 hours prior to surgery.  No nicotine patches on the day of surgery.  On the morning of surgery brush your teeth with toothpaste and water, you may rinse your mouth with mouthwash if you wish. Do not swallow any toothpaste or mouthwash.  Do not wear jewelry, make-up, hairpins, clips or nail polish.  Do not wear lotions, powders, or perfumes.   Do not shave body from the neck down 48 hours prior to surgery just in case you cut yourself which could leave a site for infection.  Also, freshly shaved skin may become irritated if using the CHG soap.  Contact lenses, hearing aids and dentures may not be worn into surgery.  Do not bring valuables to the hospital. [facility] is not responsible for any missing/lost belongings or valuables.   Use CHG Soap as directed on instruction sheet.  Notify your doctor if there is any change in your medical condition (cold, fever, infection).  Wear comfortable clothing (specific to your surgery type) to the hospital.  After surgery, you can help prevent lung complications by doing breathing exercises.  Take deep breaths and cough every 1-2 hours. Your doctor may order a device called an Incentive Spirometer to help you take deep breaths.  If you are being admitted to the hospital overnight, leave your suitcase in the car. After surgery it may be brought to your room.  If you are being discharged the day of surgery, you will not be allowed to drive  home. You will need a responsible adult (18 years or older) to drive you home and stay with you that night.   If you are taking public transportation, you will need to have a responsible adult (18 years or older) with you. Please confirm with your physician that it is acceptable to use public transportation.   Please call the [place] Dept. at [phone] if you have any questions about these  instructions.  Surgery Visitation Policy:  Patients undergoing a surgery or procedure may have one family member or support person with them as long as that person is not COVID-19 positive or experiencing its symptoms.  That person may remain in the waiting area during the procedure.  Inpatient Visitation:    Visiting hours are 7 a.m. to 8 p.m. Inpatients will be allowed two visitors daily. The visitors may change each day during the patient's stay. No visitors under the age of 43. Any visitor under the age of 66 must be accompanied by an adult. The visitor must pass COVID-19 screenings, use hand sanitizer when entering and exiting the patient's room and wear a mask at all times, including in the patient's room. Patients must also wear a mask when staff or their visitor are in the room. Masking is required regardless of vaccination status.

## 2020-12-16 ENCOUNTER — Other Ambulatory Visit
Admission: RE | Admit: 2020-12-16 | Discharge: 2020-12-16 | Disposition: A | Discharge status: Home / Self Care | Payer: BC Managed Care – PPO | Source: Ambulatory Visit | Attending: Surgery | Admitting: Surgery

## 2020-12-16 ENCOUNTER — Other Ambulatory Visit: Payer: Self-pay

## 2020-12-16 DIAGNOSIS — Z01812 Encounter for preprocedural laboratory examination: Secondary | ICD-10-CM | POA: Insufficient documentation

## 2020-12-16 DIAGNOSIS — Z20822 Contact with and (suspected) exposure to covid-19: Secondary | ICD-10-CM | POA: Diagnosis not present

## 2020-12-16 LAB — SARS CORONAVIRUS 2 (TAT 6-24 HRS): SARS Coronavirus 2: NEGATIVE

## 2020-12-19 MED ORDER — ORAL CARE MOUTH RINSE: 15.0000 mL | Freq: Once | OROMUCOSAL | Status: AC

## 2020-12-19 MED ORDER — CHLORHEXIDINE GLUCONATE 0.12 % MT SOLN: 15.0000 mL | Freq: Once | OROMUCOSAL | Status: AC

## 2020-12-19 MED ORDER — LACTATED RINGERS IV SOLN: INTRAVENOUS | Status: DC

## 2020-12-19 MED ORDER — CEFAZOLIN SODIUM-DEXTROSE 2-4 GM/100ML-% IV SOLN
2.0000 g | INTRAVENOUS | Status: AC
  Administered 2020-12-20: 2 g via INTRAVENOUS

## 2020-12-20 ENCOUNTER — Ambulatory Visit
Admission: RE | Admit: 2020-12-20 | Discharge: 2020-12-20 | Disposition: A | Discharge status: Home / Self Care | Payer: BC Managed Care – PPO | Attending: Surgery | Admitting: Surgery

## 2020-12-20 ENCOUNTER — Ambulatory Visit: Payer: BC Managed Care – PPO | Admitting: Urgent Care

## 2020-12-20 ENCOUNTER — Ambulatory Visit: Payer: BC Managed Care – PPO

## 2020-12-20 ENCOUNTER — Other Ambulatory Visit: Payer: Self-pay

## 2020-12-20 ENCOUNTER — Encounter
Admission: RE | Disposition: A | Discharge status: Home / Self Care | Payer: Self-pay | Source: Home / Self Care | Attending: Surgery

## 2020-12-20 DIAGNOSIS — M1711 Unilateral primary osteoarthritis, right knee: Secondary | ICD-10-CM | POA: Diagnosis not present

## 2020-12-20 DIAGNOSIS — Z79899 Other long term (current) drug therapy: Secondary | ICD-10-CM | POA: Insufficient documentation

## 2020-12-20 DIAGNOSIS — M23303 Other meniscus derangements, unspecified medial meniscus, right knee: Secondary | ICD-10-CM | POA: Diagnosis not present

## 2020-12-20 DIAGNOSIS — Z87891 Personal history of nicotine dependence: Secondary | ICD-10-CM | POA: Diagnosis not present

## 2020-12-20 DIAGNOSIS — Z96651 Presence of right artificial knee joint: Secondary | ICD-10-CM

## 2020-12-20 DIAGNOSIS — Z885 Allergy status to narcotic agent status: Secondary | ICD-10-CM | POA: Insufficient documentation

## 2020-12-20 HISTORY — PX: PARTIAL KNEE ARTHROPLASTY: SHX2174

## 2020-12-20 SURGERY — ARTHROPLASTY, KNEE, UNICOMPARTMENTAL
Anesthesia: General | Site: Knee | Laterality: Right

## 2020-12-20 MED ORDER — DEXAMETHASONE SODIUM PHOSPHATE 10 MG/ML IJ SOLN
INTRAMUSCULAR | Status: DC | PRN
  Administered 2020-12-20: 10 mg via INTRAVENOUS

## 2020-12-20 MED ORDER — SODIUM CHLORIDE 0.9 % IV BOLUS
250.0000 mL | Freq: Once | INTRAVENOUS | Status: AC
  Administered 2020-12-20: 250 mL via INTRAVENOUS

## 2020-12-20 MED ORDER — LIDOCAINE HCL (PF) 2 % IJ SOLN
INTRAMUSCULAR | Status: AC
  Filled 2020-12-20: qty 5

## 2020-12-20 MED ORDER — PROPOFOL 10 MG/ML IV BOLUS
INTRAVENOUS | Status: AC
  Filled 2020-12-20: qty 20

## 2020-12-20 MED ORDER — OXYCODONE HCL 5 MG PO TABS
ORAL_TABLET | ORAL | Status: AC
  Administered 2020-12-20: 5 mg via ORAL
  Filled 2020-12-20: qty 1

## 2020-12-20 MED ORDER — KETOROLAC TROMETHAMINE 15 MG/ML IJ SOLN
INTRAMUSCULAR | Status: AC
  Administered 2020-12-20: 15 mg via INTRAVENOUS
  Filled 2020-12-20: qty 1

## 2020-12-20 MED ORDER — KETOROLAC TROMETHAMINE 15 MG/ML IJ SOLN: 7.5000 mg | Freq: Four times a day (QID) | INTRAMUSCULAR | Status: DC

## 2020-12-20 MED ORDER — PHENYLEPHRINE HCL (PRESSORS) 10 MG/ML IV SOLN
INTRAVENOUS | Status: DC | PRN
  Administered 2020-12-20 (×2): 100 ug via INTRAVENOUS

## 2020-12-20 MED ORDER — MIDAZOLAM HCL 2 MG/2ML IJ SOLN
INTRAMUSCULAR | Status: DC | PRN
Start: 2020-12-20 — End: 2020-12-20
  Administered 2020-12-20: 2 mg via INTRAVENOUS

## 2020-12-20 MED ORDER — 0.9 % SODIUM CHLORIDE (POUR BTL) OPTIME
TOPICAL | Status: DC | PRN
  Administered 2020-12-20: 400 mL

## 2020-12-20 MED ORDER — SODIUM CHLORIDE 0.9 % IV SOLN
INTRAVENOUS | Status: DC | PRN
  Administered 2020-12-20: 60 mL

## 2020-12-20 MED ORDER — OXYCODONE HCL 5 MG PO TABS: 5.0000 mg | ORAL_TABLET | Freq: Once | ORAL | Status: AC | PRN

## 2020-12-20 MED ORDER — APIXABAN 2.5 MG PO TABS: 2.5000 mg | ORAL_TABLET | Freq: Two times a day (BID) | ORAL | 0 refills | Status: DC

## 2020-12-20 MED ORDER — SODIUM CHLORIDE FLUSH 0.9 % IV SOLN
INTRAVENOUS | Status: AC
  Filled 2020-12-20: qty 10

## 2020-12-20 MED ORDER — DEXAMETHASONE SODIUM PHOSPHATE 10 MG/ML IJ SOLN
INTRAMUSCULAR | Status: AC
  Filled 2020-12-20: qty 1

## 2020-12-20 MED ORDER — ACETAMINOPHEN 10 MG/ML IV SOLN
INTRAVENOUS | Status: AC
  Filled 2020-12-20: qty 100

## 2020-12-20 MED ORDER — FENTANYL CITRATE (PF) 100 MCG/2ML IJ SOLN
INTRAMUSCULAR | Status: AC
  Filled 2020-12-20: qty 2

## 2020-12-20 MED ORDER — BUPIVACAINE HCL (PF) 0.5 % IJ SOLN
INTRAMUSCULAR | Status: AC
  Filled 2020-12-20: qty 10

## 2020-12-20 MED ORDER — CHLORHEXIDINE GLUCONATE 0.12 % MT SOLN
OROMUCOSAL | Status: AC
  Administered 2020-12-20: 15 mL via OROMUCOSAL
  Filled 2020-12-20: qty 15

## 2020-12-20 MED ORDER — FENTANYL CITRATE (PF) 100 MCG/2ML IJ SOLN
INTRAMUSCULAR | Status: DC | PRN
  Administered 2020-12-20: 25 ug via INTRAVENOUS
  Administered 2020-12-20: 50 ug via INTRAVENOUS
  Administered 2020-12-20: 25 ug via INTRAVENOUS
  Administered 2020-12-20: 50 ug via INTRAVENOUS
  Administered 2020-12-20 (×2): 25 ug via INTRAVENOUS

## 2020-12-20 MED ORDER — CEFAZOLIN SODIUM-DEXTROSE 2-4 GM/100ML-% IV SOLN: 2.0000 g | Freq: Four times a day (QID) | INTRAVENOUS | Status: DC

## 2020-12-20 MED ORDER — ROCURONIUM BROMIDE 100 MG/10ML IV SOLN
INTRAVENOUS | Status: DC | PRN
  Administered 2020-12-20: 50 mg via INTRAVENOUS

## 2020-12-20 MED ORDER — BUPIVACAINE-EPINEPHRINE (PF) 0.5% -1:200000 IJ SOLN
INTRAMUSCULAR | Status: AC
  Filled 2020-12-20: qty 30

## 2020-12-20 MED ORDER — METOCLOPRAMIDE HCL 5 MG/ML IJ SOLN: 5.0000 mg | Freq: Three times a day (TID) | INTRAMUSCULAR | Status: DC | PRN

## 2020-12-20 MED ORDER — OXYCODONE HCL 5 MG PO TABS
ORAL_TABLET | ORAL | Status: AC
  Administered 2020-12-20: 5 mg via ORAL
  Filled 2020-12-20: qty 2

## 2020-12-20 MED ORDER — FENTANYL CITRATE (PF) 100 MCG/2ML IJ SOLN
INTRAMUSCULAR | Status: AC
  Administered 2020-12-20: 50 ug via INTRAVENOUS
  Filled 2020-12-20: qty 2

## 2020-12-20 MED ORDER — SODIUM CHLORIDE FLUSH 0.9 % IV SOLN
INTRAVENOUS | Status: AC
  Filled 2020-12-20: qty 40

## 2020-12-20 MED ORDER — BUPIVACAINE LIPOSOME 1.3 % IJ SUSP
INTRAMUSCULAR | Status: AC
  Filled 2020-12-20: qty 20

## 2020-12-20 MED ORDER — OXYCODONE HCL 5 MG PO TABS: 5.0000 mg | ORAL_TABLET | ORAL | Status: DC | PRN

## 2020-12-20 MED ORDER — ONDANSETRON HCL 4 MG PO TABS: 4.0000 mg | ORAL_TABLET | Freq: Four times a day (QID) | ORAL | Status: DC | PRN

## 2020-12-20 MED ORDER — METOCLOPRAMIDE HCL 10 MG PO TABS: 5.0000 mg | ORAL_TABLET | Freq: Three times a day (TID) | ORAL | Status: DC | PRN

## 2020-12-20 MED ORDER — PROMETHAZINE HCL 25 MG/ML IJ SOLN: 6.2500 mg | INTRAMUSCULAR | Status: DC | PRN

## 2020-12-20 MED ORDER — PROMETHAZINE HCL 25 MG/ML IJ SOLN
INTRAMUSCULAR | Status: AC
  Administered 2020-12-20: 6.25 mg via INTRAVENOUS
  Filled 2020-12-20: qty 1

## 2020-12-20 MED ORDER — GLYCOPYRROLATE 0.2 MG/ML IJ SOLN
INTRAMUSCULAR | Status: DC | PRN
  Administered 2020-12-20: .2 mg via INTRAVENOUS

## 2020-12-20 MED ORDER — MIDAZOLAM HCL 2 MG/2ML IJ SOLN
INTRAMUSCULAR | Status: AC
  Filled 2020-12-20: qty 2

## 2020-12-20 MED ORDER — SODIUM CHLORIDE 0.9 % IV SOLN: INTRAVENOUS | Status: DC

## 2020-12-20 MED ORDER — TRANEXAMIC ACID 1000 MG/10ML IV SOLN
INTRAVENOUS | Status: DC | PRN
  Administered 2020-12-20: 1000 mg via TOPICAL

## 2020-12-20 MED ORDER — PHENYLEPHRINE HCL (PRESSORS) 10 MG/ML IV SOLN
INTRAVENOUS | Status: AC
  Filled 2020-12-20: qty 1

## 2020-12-20 MED ORDER — PROPOFOL 10 MG/ML IV BOLUS
INTRAVENOUS | Status: DC | PRN
  Administered 2020-12-20: 150 mg via INTRAVENOUS

## 2020-12-20 MED ORDER — ONDANSETRON HCL 4 MG/2ML IJ SOLN
INTRAMUSCULAR | Status: AC
  Filled 2020-12-20: qty 2

## 2020-12-20 MED ORDER — ONDANSETRON HCL 4 MG/2ML IJ SOLN
INTRAMUSCULAR | Status: DC | PRN
  Administered 2020-12-20: 4 mg via INTRAVENOUS

## 2020-12-20 MED ORDER — FENTANYL CITRATE PF 50 MCG/ML IJ SOSY
25.0000 ug | PREFILLED_SYRINGE | INTRAMUSCULAR | Status: AC | PRN
  Administered 2020-12-20 (×2): 25 ug via INTRAVENOUS

## 2020-12-20 MED ORDER — FENTANYL CITRATE (PF) 100 MCG/2ML IJ SOLN
25.0000 ug | INTRAMUSCULAR | Status: DC | PRN
  Administered 2020-12-20: 50 ug via INTRAVENOUS

## 2020-12-20 MED ORDER — ONDANSETRON HCL 4 MG/2ML IJ SOLN: 4.0000 mg | Freq: Four times a day (QID) | INTRAMUSCULAR | Status: DC | PRN

## 2020-12-20 MED ORDER — CEFAZOLIN SODIUM-DEXTROSE 2-4 GM/100ML-% IV SOLN
INTRAVENOUS | Status: AC
  Administered 2020-12-20: 2 g via INTRAVENOUS
  Filled 2020-12-20: qty 100

## 2020-12-20 MED ORDER — PROPOFOL 1000 MG/100ML IV EMUL
INTRAVENOUS | Status: AC
  Filled 2020-12-20: qty 100

## 2020-12-20 MED ORDER — OXYCODONE HCL 5 MG PO TABS: 5.0000 mg | ORAL_TABLET | ORAL | 0 refills | Status: DC | PRN

## 2020-12-20 MED ORDER — TRANEXAMIC ACID 1000 MG/10ML IV SOLN
INTRAVENOUS | Status: AC
  Filled 2020-12-20: qty 10

## 2020-12-20 MED ORDER — ACETAMINOPHEN 10 MG/ML IV SOLN
INTRAVENOUS | Status: DC | PRN
Start: 2020-12-20 — End: 2020-12-20
  Administered 2020-12-20: 1000 mg via INTRAVENOUS

## 2020-12-20 MED ORDER — DEXMEDETOMIDINE (PRECEDEX) IN NS 20 MCG/5ML (4 MCG/ML) IV SYRINGE
PREFILLED_SYRINGE | INTRAVENOUS | Status: DC | PRN
  Administered 2020-12-20: 4 ug via INTRAVENOUS
  Administered 2020-12-20 (×2): 8 ug via INTRAVENOUS

## 2020-12-20 MED ORDER — OXYCODONE HCL 5 MG/5ML PO SOLN: 5.0000 mg | Freq: Once | ORAL | Status: AC | PRN

## 2020-12-20 MED ORDER — ACETAMINOPHEN 500 MG PO TABS: 1000.0000 mg | ORAL_TABLET | Freq: Four times a day (QID) | ORAL | Status: DC

## 2020-12-20 MED ORDER — CEFAZOLIN SODIUM-DEXTROSE 2-4 GM/100ML-% IV SOLN
INTRAVENOUS | Status: AC
  Filled 2020-12-20: qty 100

## 2020-12-20 MED ORDER — DEXMEDETOMIDINE HCL IN NACL 200 MCG/50ML IV SOLN
INTRAVENOUS | Status: AC
  Filled 2020-12-20: qty 50

## 2020-12-20 MED ORDER — LIDOCAINE HCL (CARDIAC) PF 100 MG/5ML IV SOSY
PREFILLED_SYRINGE | INTRAVENOUS | Status: DC | PRN
  Administered 2020-12-20: 100 mg via INTRAVENOUS

## 2020-12-20 MED ORDER — SODIUM CHLORIDE 0.9 % IR SOLN
Status: DC | PRN
  Administered 2020-12-20: 3000 mL

## 2020-12-20 MED ORDER — KETOROLAC TROMETHAMINE 15 MG/ML IJ SOLN: 15.0000 mg | Freq: Once | INTRAMUSCULAR | Status: AC

## 2020-12-20 MED ORDER — SUGAMMADEX SODIUM 500 MG/5ML IV SOLN
INTRAVENOUS | Status: DC | PRN
  Administered 2020-12-20: 200 mg via INTRAVENOUS

## 2020-12-20 MED ORDER — MEPERIDINE HCL 25 MG/ML IJ SOLN: 6.2500 mg | INTRAMUSCULAR | Status: DC | PRN

## 2020-12-20 SURGICAL SUPPLY — 66 items
BEARING TIB MENISCAL RT XS 4MM (Knees) ×1 IMPLANT
BIT DRILL QUICK REL 1/8 2PK SL (DRILL) ×1 IMPLANT
BNDG ELASTIC 6X5.8 VLCR STR LF (GAUZE/BANDAGES/DRESSINGS) ×2 IMPLANT
CANISTER SUCT 1200ML W/VALVE (MISCELLANEOUS) IMPLANT
CEMENT BONE R 1X40 (Cement) ×2 IMPLANT
CEMENT VACUUM MIXING SYSTEM (MISCELLANEOUS) ×2 IMPLANT
CHLORAPREP W/TINT 26 (MISCELLANEOUS) ×4 IMPLANT
COMPONENT TIBIAL OXFRD MEDL RT (Orthopedic Implant) ×1 IMPLANT
COOLER POLAR GLACIER W/PUMP (MISCELLANEOUS) IMPLANT
COVER MAYO STAND REUSABLE (DRAPES) ×2 IMPLANT
CUFF TOURN SGL QUICK 24 (TOURNIQUET CUFF)
CUFF TOURN SGL QUICK 34 (TOURNIQUET CUFF)
CUFF TRNQT CYL 24X4X16.5-23 (TOURNIQUET CUFF) IMPLANT
CUFF TRNQT CYL 34X4.125X (TOURNIQUET CUFF) IMPLANT
DRAPE C-ARM XRAY 36X54 (DRAPES) IMPLANT
DRILL QUICK RELEASE 1/8 INCH (DRILL) ×1
DRSG MEPILEX SACRM 8.7X9.8 (GAUZE/BANDAGES/DRESSINGS) ×2 IMPLANT
DRSG OPSITE POSTOP 4X12 (GAUZE/BANDAGES/DRESSINGS) IMPLANT
DRSG OPSITE POSTOP 4X6 (GAUZE/BANDAGES/DRESSINGS) ×2 IMPLANT
ELECT CAUTERY BLADE 6.4 (BLADE) ×2 IMPLANT
ELECT REM PT RETURN 9FT ADLT (ELECTROSURGICAL) ×2
ELECTRODE REM PT RTRN 9FT ADLT (ELECTROSURGICAL) ×1 IMPLANT
GAUZE 4X4 16PLY ~~LOC~~+RFID DBL (SPONGE) ×4 IMPLANT
GAUZE SPONGE 4X4 12PLY STRL (GAUZE/BANDAGES/DRESSINGS) ×2 IMPLANT
GAUZE XEROFORM 1X8 LF (GAUZE/BANDAGES/DRESSINGS) ×2 IMPLANT
GLOVE SRG 8 PF TXTR STRL LF DI (GLOVE) ×1 IMPLANT
GLOVE SURG ENC MOIS LTX SZ7.5 (GLOVE) ×8 IMPLANT
GLOVE SURG ENC MOIS LTX SZ8 (GLOVE) ×8 IMPLANT
GLOVE SURG UNDER LTX SZ8 (GLOVE) ×2 IMPLANT
GLOVE SURG UNDER POLY LF SZ8 (GLOVE) ×1
GOWN STRL REUS W/ TWL LRG LVL3 (GOWN DISPOSABLE) ×1 IMPLANT
GOWN STRL REUS W/ TWL XL LVL3 (GOWN DISPOSABLE) ×1 IMPLANT
GOWN STRL REUS W/TWL LRG LVL3 (GOWN DISPOSABLE) ×1
GOWN STRL REUS W/TWL XL LVL3 (GOWN DISPOSABLE) ×1
HOOD PEEL AWAY FLYTE STAYCOOL (MISCELLANEOUS) ×6 IMPLANT
IV NS IRRIG 3000ML ARTHROMATIC (IV SOLUTION) ×2 IMPLANT
KIT TURNOVER KIT A (KITS) ×2 IMPLANT
KNEE PARTIAL CEMENT FEM XS (Miscellaneous) ×2 IMPLANT
MANIFOLD NEPTUNE II (INSTRUMENTS) ×2 IMPLANT
MAT ABSORB  FLUID 56X50 GRAY (MISCELLANEOUS) ×1
MAT ABSORB FLUID 56X50 GRAY (MISCELLANEOUS) ×1 IMPLANT
NDL SAFETY ECLIPSE 18X1.5 (NEEDLE) ×1 IMPLANT
NEEDLE HYPO 18GX1.5 SHARP (NEEDLE) ×1
NEEDLE SPNL 20GX3.5 QUINCKE YW (NEEDLE) ×2 IMPLANT
NS IRRIG 1000ML POUR BTL (IV SOLUTION) ×2 IMPLANT
PACK BLADE SAW RECIP 70 3 PT (BLADE) ×2 IMPLANT
PACK TOTAL KNEE (MISCELLANEOUS) ×2 IMPLANT
PAD ABD DERMACEA PRESS 5X9 (GAUZE/BANDAGES/DRESSINGS) ×4 IMPLANT
PAD WRAPON POLAR KNEE (MISCELLANEOUS) IMPLANT
PULSAVAC PLUS IRRIG FAN TIP (DISPOSABLE) ×2
SPONGE T-LAP 18X18 ~~LOC~~+RFID (SPONGE) ×6 IMPLANT
STAPLER SKIN PROX 35W (STAPLE) ×2 IMPLANT
STRAP SAFETY 5IN WIDE (MISCELLANEOUS) ×2 IMPLANT
SUCTION FRAZIER HANDLE 10FR (MISCELLANEOUS) ×1
SUCTION TUBE FRAZIER 10FR DISP (MISCELLANEOUS) ×1 IMPLANT
SUT VIC AB 0 CT1 36 (SUTURE) ×2 IMPLANT
SUT VIC AB 2-0 CT1 27 (SUTURE) ×4
SUT VIC AB 2-0 CT1 TAPERPNT 27 (SUTURE) ×4 IMPLANT
SYR 10ML LL (SYRINGE) ×2 IMPLANT
SYR 30ML LL (SYRINGE) ×4 IMPLANT
TAPE TRANSPORE STRL 2 31045 (GAUZE/BANDAGES/DRESSINGS) ×2 IMPLANT
TIBIAL OXFORD MEDIAL RT (Orthopedic Implant) ×2 IMPLANT
TIP FAN IRRIG PULSAVAC PLUS (DISPOSABLE) ×1 IMPLANT
UNICOMP OXFORD MENISCAL XS 4MM (Knees) ×2 IMPLANT
WATER STERILE IRR 500ML POUR (IV SOLUTION) ×2 IMPLANT
WRAPON POLAR PAD KNEE (MISCELLANEOUS)

## 2020-12-20 NOTE — Anesthesia Procedure Notes (Signed)
Procedure Name: Intubation Date/Time: 12/20/2020 7:46 AM Performed by: Kelton Pillar, CRNA Pre-anesthesia Checklist: Patient identified, Emergency Drugs available, Suction available and Patient being monitored Patient Re-evaluated:Patient Re-evaluated prior to induction Oxygen Delivery Method: Circle system utilized Preoxygenation: Pre-oxygenation with 100% oxygen Induction Type: IV induction Ventilation: Mask ventilation without difficulty Laryngoscope Size: McGraph and 3 Grade View: Grade I Tube type: Oral Tube size: 7.0 mm Number of attempts: 1 Airway Equipment and Method: Stylet and Oral airway Placement Confirmation: ETT inserted through vocal cords under direct vision, positive ETCO2, breath sounds checked- equal and bilateral and CO2 detector Secured at: 21 cm Tube secured with: Tape Dental Injury: Teeth and Oropharynx as per pre-operative assessment

## 2020-12-20 NOTE — Transfer of Care (Signed)
Immediate Anesthesia Transfer of Care Note  Patient: Alejandra Wilson  Procedure(s) Performed: RIGHT PARTIAL KNEE REPLACEMENT (Right: Knee)  Patient Location: PACU  Anesthesia Type:General  Level of Consciousness: awake and alert   Airway & Oxygen Therapy: Patient Spontanous Breathing  Post-op Assessment: Report given to RN and Post -op Vital signs reviewed and stable  Post vital signs: Reviewed and stable  Last Vitals:  Vitals Value Taken Time  BP    Temp    Pulse    Resp    SpO2      Last Pain:  Vitals:   12/20/20 0629  TempSrc: Oral         Complications: No notable events documented.

## 2020-12-20 NOTE — Anesthesia Preprocedure Evaluation (Signed)
Anesthesia Evaluation  Patient identified by MRN, date of birth, ID band Patient awake    Reviewed: Allergy & Precautions, NPO status , Patient's Chart, lab work & pertinent test results  History of Anesthesia Complications Negative for: history of anesthetic complications  Airway Mallampati: II  TM Distance: >3 FB Neck ROM: Full    Dental  (+) Poor Dentition, Missing   Pulmonary neg sleep apnea, neg COPD, Current Smoker (ecigarettes)Patient did not abstain from smoking.,    breath sounds clear to auscultation- rhonchi (-) wheezing      Cardiovascular Exercise Tolerance: Good (-) hypertension(-) CAD, (-) Past MI, (-) Cardiac Stents and (-) CABG  Rhythm:Regular Rate:Normal - Systolic murmurs and - Diastolic murmurs    Neuro/Psych neg Seizures negative neurological ROS  negative psych ROS   GI/Hepatic Neg liver ROS, GERD  ,  Endo/Other  neg diabetesHypothyroidism   Renal/GU negative Renal ROS     Musculoskeletal  (+) Arthritis ,   Abdominal (+) + obese,   Peds  Hematology negative hematology ROS (+)   Anesthesia Other Findings Past Medical History: No date: GERD (gastroesophageal reflux disease) No date: Hyperlipidemia No date: Hypothyroidism No date: Insomnia No date: Osteoarthritis of right knee No date: Rheumatoid arthritis involving multiple sites with positive  rheumatoid factor (HCC)   Reproductive/Obstetrics                             Anesthesia Physical Anesthesia Plan  ASA: 2  Anesthesia Plan: General   Post-op Pain Management:    Induction: Intravenous  PONV Risk Score and Plan: 2 and Ondansetron, Dexamethasone and Midazolam  Airway Management Planned: Oral ETT  Additional Equipment:   Intra-op Plan:   Post-operative Plan: Extubation in OR  Informed Consent: I have reviewed the patients History and Physical, chart, labs and discussed the procedure including  the risks, benefits and alternatives for the proposed anesthesia with the patient or authorized representative who has indicated his/her understanding and acceptance.     Dental advisory given  Plan Discussed with: CRNA and Anesthesiologist  Anesthesia Plan Comments: (Pt refused spinal, proceeding with GA)        Anesthesia Quick Evaluation

## 2020-12-20 NOTE — Evaluation (Signed)
Physical Therapy Evaluation Patient Details Name: Alejandra Wilson MRN: [id number] DOB: Jul 19, 1955 Today's Date: 12/20/2020   History of Present Illness  Pt is a 64 yo F diagnosed with osteoarthritis of the medial compartment of the R knee with degenerative medial meniscus tear and is s/p right unicondylar knee arthroplasty.  PMH includes hypothyroidism, GERD, HLD, RA, and insomnia.   Clinical Impression  Pt was pleasant and motivated to participate during the session and put forth very good effort throughout.  Pt required no physical assistance during the session and demonstrated good control and stability with transfers, gait, and stairs.  Pt demonstrated good carryover of proper sequencing during stair and gait training. No adverse symptoms noted other than mod R knee pain with SpO2 and HR WNL.  Pt will benefit from HHPT upon discharge to safely address deficits listed in patient problem list for decreased caregiver assistance and eventual return to PLOF.      Follow Up Recommendations Home health PT;Supervision for mobility/OOB    Equipment Recommendations  None recommended by PT    Recommendations for Other Services       Precautions / Restrictions Restrictions Weight Bearing Restrictions: Yes RLE Weight Bearing: Weight bearing as tolerated      Mobility  Bed Mobility Overal bed mobility: Modified Independent             General bed mobility comments: Extra time and effort only    Transfers Overall transfer level: Needs assistance Equipment used: Rolling walker (2 wheeled) Transfers: Sit to/from Stand Sit to Stand: Min guard         General transfer comment: Good control and stability with pt heavily relying on LLE secondary to poor R knee ROM  Ambulation/Gait Ambulation/Gait assistance: Min guard Gait Distance (Feet): 100 Feet Assistive device: Rolling walker (2 wheeled) Gait Pattern/deviations: Step-to pattern;Step-through pattern;Decreased step length -  right;Decreased step length - left;Antalgic;Decreased stance time - right Gait velocity: decreased   General Gait Details: Step-to pattern that progressed to step-through pattern with good stability and no R knee buckling  Stairs Stairs: Yes Stairs assistance: Min guard Stair Management: With walker;Backwards;Forwards;Step to pattern Number of Stairs: 1 General stair comments: Ascend/descend one step x 2 ascending backwards and descending forwards with min verbal cues for sequencing  Wheelchair Mobility    Modified Rankin (Stroke Patients Only)       Balance Overall balance assessment: Needs assistance   Sitting balance-Leahy Scale: Normal     Standing balance support: Bilateral upper extremity supported;During functional activity Standing balance-Leahy Scale: Good                               Pertinent Vitals/Pain Pain Assessment: 0-10 Pain Score: 5  Pain Location: R knee Pain Descriptors / Indicators: Sore Pain Intervention(s): Premedicated before session;Monitored during session;Repositioned;Ice applied    Home Living Family/patient expects to be discharged to:: Private residence Living Arrangements: Spouse/significant other;Children Available Help at Discharge: Available 24 hours/day Type of Home: House Home Access: Stairs to enter Entrance Stairs-Rails: None Entrance Stairs-Number of Steps: 1 Home Layout: One level Home Equipment: Shower seat - built in;Bedside commode;Walker - 2 wheels;Cane - quad      Prior Function Level of Independence: Independent         Comments: Ind amb community distances without an AD, no fall history, works in [employer], Ind with ADLs     Hand Dominance        Extremity/Trunk Assessment  Upper Extremity Assessment Upper Extremity Assessment: Overall WFL for tasks assessed    Lower Extremity Assessment Lower Extremity Assessment: Generalized weakness;RLE deficits/detail RLE Deficits / Details: BLE ankle  strength, AROM, and sensation to light touch WFL RLE: Unable to fully assess due to pain RLE Sensation: WNL       Communication   Communication: No difficulties  Cognition Arousal/Alertness: Awake/alert Behavior During Therapy: WFL for tasks assessed/performed Overall Cognitive Status: Within Functional Limits for tasks assessed                                        General Comments      Exercises Total Joint Exercises Ankle Circles/Pumps: AROM;Strengthening;Both;10 reps Quad Sets: AROM;Strengthening;Right;10 reps;15 reps Long Arc Quad: AROM;Strengthening;Right;10 reps;15 reps Knee Flexion: AROM;Strengthening;Right;10 reps;15 reps Goniometric ROM: R knee AROM: 14-72 deg Marching in Standing: AROM;Strengthening;Both;5 reps;Standing Other Exercises Other Exercises: Positioning education to promote R knee ext PROM with heel pressure relief Other Exercises: 90 deg R turn training to prevent CKC twisting on the R knee Other Exercises: HEP education/review per handout Other Exercises: Car transfer sequencing education/review   Assessment/Plan    PT Assessment Patient needs continued PT services  PT Problem List Decreased strength;Decreased range of motion;Decreased activity tolerance;Decreased balance;Decreased mobility;Decreased knowledge of use of DME;Pain       PT Treatment Interventions DME instruction;Gait training;Stair training;Functional mobility training;Therapeutic activities;Balance training;Therapeutic exercise;Patient/family education    PT Goals (Current goals can be found in the Care Plan section)  Acute Rehab PT Goals Patient Stated Goal: To walk without pain PT Goal Formulation: With patient Time For Goal Achievement: 01/02/21 Potential to Achieve Goals: Good    Frequency BID   Barriers to discharge        Co-evaluation               AM-PAC PT "6 Clicks" Mobility  Outcome Measure Help needed turning from your back to your side  while in a flat bed without using bedrails?: A Little Help needed moving from lying on your back to sitting on the side of a flat bed without using bedrails?: A Little Help needed moving to and from a bed to a chair (including a wheelchair)?: A Little Help needed standing up from a chair using your arms (e.g., wheelchair or bedside chair)?: A Little Help needed to walk in hospital room?: A Little Help needed climbing 3-5 steps with a railing? : A Little 6 Click Score: 18    End of Session Equipment Utilized During Treatment: Gait belt Activity Tolerance: Patient tolerated treatment well Patient left: in chair;with nursing/sitter in room;Other (comment) (Polar care donned to R knee) Nurse Communication: Mobility status PT Visit Diagnosis: Other abnormalities of gait and mobility (R26.89);Muscle weakness (generalized) (M62.81);Pain Pain - Right/Left: Right Pain - part of body: Knee    Time: 1340-1419 PT Time Calculation (min) (ACUTE ONLY): 39 min   Charges:   PT Evaluation $PT Eval Moderate Complexity: 1 Mod PT Treatments $Gait Training: 8-22 mins $Therapeutic Exercise: 8-22 mins        D. Scott Gari Hartsell PT, DPT 12/20/20, 2:42 PM

## 2020-12-20 NOTE — H&P (Signed)
History of Present Illness: Alejandra Wilson is a 65 y.o.female who is being seen for a new patient visit for right knee pain. The patient has had a long history of intermittent aching discomfort in her right knee, but she notes that the symptoms have worsened since the beginning of the year. She describes a constant "tightness" in the knee and notes that it "aches" constantly. She denies any reinjury to the knee. She reports 5/10 pain on today's visit. The pain is located along the medial aspect of the knee. The pain is described as aching, dull and throbbing. The symptoms are aggravated with normal daily activities, with sleeping, using stairs, at higher levels of activity, walking, standing and activity in general. She also describes no mechanical symptoms. She has associated mild swelling and deformity. She has tried acetaminophen and anti-inflammatories with limited benefit. She works full-time as a Photographer.  Current Outpatient Medications  acetaminophen (TYLENOL) 500 MG tablet Take 500 mg by mouth as needed for Pain   diclofenac (VOLTAREN) 50 MG EC tablet Take 1 tablet (50 mg total) by mouth 2 (two) times daily with meals 180 tablet 1   hydrOXYchloroQUINE (PLAQUENIL) 200 mg tablet Take 1 tablet (200 mg total) by mouth once daily 90 tablet 1   levothyroxine (SYNTHROID) 100 MCG tablet Take 1 tablet (100 mcg total) by mouth once daily Take on an empty stomach with a glass of water at least 30-60 minutes before breakfast. 30 tablet 4   omeprazole (PRILOSEC) 20 MG DR capsule Take 1 capsule (20 mg total) by mouth once daily 90 capsule 1   simvastatin (ZOCOR) 20 MG tablet Take 1 tablet (20 mg total) by mouth nightly 90 tablet 1   upadacitinib (RINVOQ ORAL) Take by mouth   Allergies:   Codeine Unknown and Nausea   Past Medical History:   Allergic rhinitis   Hyperlipidemia   Hypertension   Hypothyroidism   Rheumatoid arthritis involving multiple sites with positive rheumatoid factor  (CMS-HCC) - Methotrexate. Nodules. Plaquenil. Declines TNF drugs   Tobacco abuse   Past Surgical History:   Cyst removed from ovary   Left unicondylar knee arthroplasty. Left 07/09/2019 (Dr. Roland Rack)   Family History:   Myocardial Infarction (Heart attack) Father   Diabetes Mother   Heart failure Mother   Coronary Artery Disease (Blocked arteries around heart) Mother   Heart disease Brother   Diabetes Brother   High blood pressure (Hypertension) Brother   Diabetes Brother   Heart disease Brother   High blood pressure (Hypertension) Brother   Social History:   Socioeconomic History:   Marital status: Married  Tobacco Use   Smoking status: Former Smoker  Quit date: 08/22/2014  Years since quitting: 6.0   Smokeless tobacco: Never Used   Tobacco comment: Pt uses Vape occassionally  Substance and Sexual Activity   Alcohol use: No  Alcohol/week: 0.0 standard drinks   Drug use: No   Sexual activity: Defer   Review of Systems:  A comprehensive 14 point ROS was performed, reviewed, and the pertinent orthopaedic findings are documented in the HPI.  Physical Exam: Vitals:  09/12/20 0847  BP: 122/84  Weight: 81.9 kg (180 lb 9.6 oz)  Height: 160 cm ('5\' 3"'$ )  PainSc: 5  PainLoc: Knee   General/Constitutional: The patient appears to be well-nourished, well-developed, and in no acute distress. Neuro/Psych: Normal mood and affect, oriented to person, place and time. Eyes: Non-icteric. Pupils are equal, round, and reactive to light, and exhibit synchronous movement. Lymphatic:  No palpable adenopathy. Respiratory: No wheezes and Non-labored breathing Cardiovascular: No edema, swelling or tenderness, except as noted in detailed exam. Vascular: No edema, swelling or tenderness, except as noted in detailed exam. Integumentary: No impressive skin lesions present, except as noted in detailed exam. Musculoskeletal: Unremarkable, except as noted in detailed exam.  Right knee exam: GAIT: At  most a minimal limp, favoring her right leg, and uses no assistive devices. ALIGNMENT: mild varus SKIN: unremarkable SWELLING: mild EFFUSION: small WARMTH: no warmth TENDERNESS: mild over the medial joint line, but no lateral or peripatellar tenderness ROM: 5 to 105 degrees McMURRAY'S: equivocal PATELLOFEMORAL: normal tracking with no peri-patellar tenderness and negative apprehension sign CREPITUS: no LACHMAN'S: negative PIVOT SHIFT: negative ANTERIOR DRAWER: negative POSTERIOR DRAWER: negative VARUS/VALGUS: positive pseudolaxity to varus stressing  She is neurovascularly intact to the right lower extremity and foot.  Knee Imaging: AP weightbearing of both knees, as well as lateral and merchant views of the right knee are obtained. These films demonstrate severe degenerative changes, primarily involving the medial compartment with 90-100% medial joint space narrowing. The lateral and patellofemoral compartments appear to well-maintained. Overall alignment is mild varus. No fractures, lytic lesions, or abnormal calcifications are noted.  Assessment:  1. Primary osteoarthritis of right knee.   Plan: The treatment options were discussed with the patient. In addition, patient educational materials were provided regarding the diagnosis and treatment options. The patient is frustrated by her symptoms and function limitations, and is ready to consider more aggressive treatment options. Therefore, I have recommended a surgical procedure, specifically a right partial knee replacement. The risks (including bleeding, infection, nerve and/or blood vessel injury, persistent or recurrent pain, loosening or failure of the components, dislocation, need for further surgery, blood clots, strokes, heart attacks or arrhythmias, pneumonia, etc.) and benefits of the surgical procedure were discussed. The patient states her understanding and agrees to proceed. A formal written consent will be obtained by the  nursing staff.   H&P reviewed and patient re-examined. No changes.

## 2020-12-20 NOTE — Discharge Instructions (Addendum)
Orthopedic discharge instructions: May shower with intact op-site dressing. Apply ice frequently to knee or use Polar Care. Start Eliquis 2.5 mg BID for 2 weeks on Wednesday, 8/31, then start aspirin 325 mg daily for 4 weeks. Take pain medication as prescribed when needed.  May supplement with ES Tylenol if necessary. May weight-bear as tolerated on right leg - use walker for balance and support. Follow-up in 10-14 days or as scheduled.    AMBULATORY SURGERY  DISCHARGE INSTRUCTIONS   The drugs that you were given will stay in your system until tomorrow so for the next 24 hours you should not:  Drive an automobile Make any legal decisions Drink any alcoholic beverage   You may resume regular meals tomorrow.  Today it is better to start with liquids and gradually work up to solid foods.  You may eat anything you prefer, but it is better to start with liquids, then soup and crackers, and gradually work up to solid foods.   Please notify your doctor immediately if you have any unusual bleeding, trouble breathing, redness and pain at the surgery site, drainage, fever, or pain not relieved by medication.    Additional Instructions:        Please contact your physician with any problems or Same Day Surgery at [phone], Monday through Friday 6 am to 4 pm, or [place] at [facility] number at [phone].

## 2020-12-20 NOTE — Op Note (Addendum)
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  Post-Op Diagnosis:   Same  Procedure:   Right unicondylar knee arthroplasty.  Surgeon:   Pascal Lux, MD  Assistant:   Cameron Proud, PA-C  Anesthesia:   GET  Findings:   As above.  Complications:   None  EBL:   10 cc  Fluids:   400 cc crystalloid  UOP:   None  TT:   75 minutes at 300 mmHg  Drains:   None  Closure:   Staples  Implants:   All-cemented Biomet Oxford system with an XS femoral component, a "B" sized tibial tray, and a 4 mm meniscal bearing insert.  Brief Clinical Note:   The patient is a 65 year old female with a history of progressively worsening medial sided right knee pain. Her symptoms have persisted despite medications, activity modification, etc. Her history and examination consistent with moderate to severe degenerative joint disease with a complex medial meniscus tear, both of which were confirmed by MRI scan. The patient presents at this time for a right partial knee replacement.  Procedure:   The patient was brought into the operating room and lain in the supine position. After adequate general endotracheal intubation and anesthesia was obtained, the patient was repositioned so that the non-surgical leg was placed in a flexed and abducted position in the yellow fin leg holder while the surgical extremity was placed over the Biomet leg holder. The right lower extremity was prepped with ChloraPrep solution before being draped sterilely. Preoperative antibiotics were administered. After performing a timeout to verify the appropriate surgical site, the limb was exsanguinated with an Esmarch and the tourniquet inflated to 300 mmHg.   A standard anterior approach to the knee was made through an approximately 3.5-4 inch incision. The incision was carried down through the subcutaneous tissues to  expose the superficial retinaculum. This was split the length the incision and the medial flap elevated sufficiently to expose the medial retinaculum. The medial retinaculum was incised along the medial border of the patella tendon and extended proximally along the medial border of the patella, leaving a 3-4 mm cuff of tissue. The soft tissues were elevated off the anteromedial aspect of the proximal tibia. The anterior portion of the meniscus was removed after performing a subtotal excision of the infrapatellar fat pad. The anterior cruciate ligament was inspected and found to be in excellent condition. Osteophytes were removed from the inferior pole of the patella as well as from the notch using a quarter-inch osteotome. There were significant degenerative changes of both the femur and tibia on the medial side. The medial femoral condyle was sized using the small and medium sizers. It was felt that the extra small guide best optimized the contour of the femur. This was left in place and the external tibial guide positioned. The coupling device was used to connect the guide to the medial femoral condylar sizer to optimize appropriate orientation. Two guide pins were inserted into the cutting block before the coupling device and sizer were removed. The appropriate tibial cut was made using the oscillating and reciprocating saws. The piece was removed in its entirety and taken to the back table where it was sized and found to be optimally replicated by a "B" sized component. The 9 mm spacer was inserted to verify that sufficient bone had been removed.  Attention was directed to femoral side.  The intramedullary canal was accessed through a 4 mm drill hole. The intramedullary guide was positioned before the guide for the femoral condylar holes was positioned. The appropriate coupling device connected this guide to the intramedullary guide before both drill holes were created in the distal aspect of the medial femoral  condyle. The devices were removed and the posterior condylar cutting block inserted. The appropriate cut was made using the reciprocating saw and this piece removed. The #0 spigot was inserted and the initial bone milling performed. A trial femoral component was inserted and both the flexion and extension gaps measured. In flexion, the gap measured 7 mm whereas in extension, it measured 3 mm. Therefore, the #4 spigot was selected and the secondary bone milling performed. Repeat sizing demonstrated symmetric flexion and extension gaps. The bone was removed from the postero-medial and postero-lateral aspects of the femoral condyle, as well as from the beneath the collar of the spigot. Bone also was removed from the anterior portion of the femur so as to minimize any potential impingement with the meniscal bearing insert. The trial components removed and several drill holes placed into the distal femoral condyle to further augment cement fixation.  Attention was redirected to the tibial side. The "B" sized tibial tray was positioned and temporarily secured using the appropriate spiked nail. The keel was created using the bi-bladed reciprocating saw and hoe. The keeled "B" sized trial tibial tray was inserted to be sure that it seated properly. At this point, a total of 20 cc of Exparel diluted out to 60 cc with normal saline and 30 cc of 0.5% Sensorcaine was injected in and around the posterior and medial capsular tissues, as well as the peri-incisional tissues to help with postoperative pain control.  The bony surfaces were prepared for cementing by irrigating them thoroughly with bacitracin saline solution using the jet lavage system before packing them with a dry Ray-Tec sponge. Meanwhile, cement was being mixed on the back table. When the cement was ready, the tibial tray was cemented in first. The excess cement was removed using a Surveyor, quantity after impacting it into place. Next, the femoral component was  impacted into place. Again the excess cement was removed using a Surveyor, quantity. The 5 mm spacer was inserted and the knee brought into near full extension while the cement hardened. Once the cement hardened, the spacer was removed and the 4 mm meniscal bearing insert was trialed. This demonstrated excellent tracking while the knee was placed through a range of motion, and showed no evidence towards subluxation or dislocation. In addition, it did not fit too tightly. Therefore, the permanent 4 mm meniscal bearing insert was snapped into position after verifying that no cement had been retained posteriorly. Again the knee was placed through a range of motion with the findings as described above.  The wound was copiously irrigated with sterile saline solution via the jet lavage system before the retinacular layer was reapproximated using #0 Vicryl interrupted sutures. At this point, 1 g of transexemic acid in 10 cc of normal saline was injected intra-articularly. The subcutaneous tissues were closed in two layers using 2-0 Vicryl interrupted sutures before the skin was closed using staples. A sterile occlusive dressing was applied to the knee before the patient was awakened, extubated, and transferred to the recovery room in satisfactory condition after tolerating the procedure well. A Polar Care device was applied to the knee as well.

## 2020-12-20 NOTE — Anesthesia Postprocedure Evaluation (Signed)
Anesthesia Post Note  Patient: Alejandra Wilson  Procedure(s) Performed: RIGHT PARTIAL KNEE REPLACEMENT (Right: Knee)  Patient location during evaluation: PACU Anesthesia Type: General Level of consciousness: awake and alert and oriented Pain management: pain level controlled Vital Signs Assessment: post-procedure vital signs reviewed and stable Respiratory status: spontaneous breathing, nonlabored ventilation and respiratory function stable Cardiovascular status: blood pressure returned to baseline and stable Postop Assessment: no signs of nausea or vomiting Anesthetic complications: no   No notable events documented.   Last Vitals:  Vitals:   12/20/20 1000 12/20/20 1015  BP: 128/70 114/64  Pulse: 77 72  Resp: 15 15  Temp:    SpO2: 95% 97%    Last Pain:  Vitals:   12/20/20 1015  TempSrc:   PainSc: Asleep                 Thurley Francesconi

## 2020-12-21 ENCOUNTER — Encounter: Payer: Self-pay | Admitting: Surgery

## 2021-02-03 ENCOUNTER — Other Ambulatory Visit: Payer: Self-pay | Admitting: Surgery

## 2021-02-07 ENCOUNTER — Encounter
Admission: RE | Admit: 2021-02-07 | Discharge: 2021-02-07 | Disposition: A | Discharge status: Home / Self Care | Payer: BC Managed Care – PPO | Source: Ambulatory Visit | Attending: Surgery | Admitting: Surgery

## 2021-02-07 ENCOUNTER — Other Ambulatory Visit: Payer: Self-pay

## 2021-02-07 NOTE — Patient Instructions (Signed)
Your procedure is scheduled on: Thursday, October 20 Report to the Registration Desk on the 1st floor of the [facility]. To find out your arrival time, please call [phone] between 1PM - 3PM on: Wednesday, October 19  REMEMBER: Instructions that are not followed completely may result in serious medical risk, up to and including death; or upon the discretion of your surgeon and anesthesiologist your surgery may need to be rescheduled.  Do not eat food after midnight the night before surgery.  No gum chewing, lozengers or hard candies.  You may however, drink CLEAR liquids up to 2 hours before you are scheduled to arrive for your surgery. Do not drink anything within 2 hours of your scheduled arrival time.  Clear liquids include: - water  - apple juice without pulp - gatorade (not RED, PURPLE, OR BLUE) - black coffee or tea (Do NOT add milk or creamers to the coffee or tea) Do NOT drink anything that is not on this list.  In addition, your doctor has ordered for you to drink the provided  Ensure Pre-Surgery Clear Carbohydrate Drink  Drinking this carbohydrate drink up to two hours before surgery helps to reduce insulin resistance and improve patient outcomes. Please complete drinking 2 hours prior to scheduled arrival time.  TAKE THESE MEDICATIONS THE MORNING OF SURGERY WITH A SIP OF WATER:  Tylenol of tramadol if needed for pain Euthyrox Omeprazole (Prilosec) - (take one the night before and one on the morning of surgery - helps to prevent nausea after surgery.)  One week prior to surgery: Stop Anti-inflammatories (NSAIDS) such as Advil, Aleve, Ibuprofen, Motrin, Naproxen, Naprosyn and Aspirin based products such as Excedrin, Goodys Powder, BC Powder. Stop ANY OVER THE COUNTER supplements until after surgery. You may however, continue to take Tylenol if needed for pain up until the day of surgery.  No Alcohol for 24 hours before or after surgery.  No Smoking including  e-cigarettes for 24 hours prior to surgery.  No chewable tobacco products for at least 6 hours prior to surgery.  No nicotine patches on the day of surgery.  Do not use any "recreational" drugs for at least a week prior to your surgery.  Please be advised that the combination of cocaine and anesthesia may have negative outcomes, up to and including death. If you test positive for cocaine, your surgery will be cancelled.  On the morning of surgery brush your teeth with toothpaste and water, you may rinse your mouth with mouthwash if you wish. Do not swallow any toothpaste or mouthwash.  Do not wear jewelry, make-up, hairpins, clips or nail polish.  Do not wear lotions, powders, or perfumes.   Do not shave body from the neck down 48 hours prior to surgery just in case you cut yourself which could leave a site for infection.   Contact lenses, hearing aids and dentures may not be worn into surgery.  Do not bring valuables to the hospital. [facility] is not responsible for any missing/lost belongings or valuables.   Notify your doctor if there is any change in your medical condition (cold, fever, infection).  Wear comfortable clothing (specific to your surgery type) to the hospital.  After surgery, you can help prevent lung complications by doing breathing exercises.  Take deep breaths and cough every 1-2 hours. Your doctor may order a device called an Incentive Spirometer to help you take deep breaths.  If you are being discharged the day of surgery, you will not be allowed  to drive home. You will need a responsible adult (18 years or older) to drive you home and stay with you that night.   If you are taking public transportation, you will need to have a responsible adult (18 years or older) with you. Please confirm with your physician that it is acceptable to use public transportation.   Please call the [place] Dept. at [phone] if you have any questions about  these instructions.  Surgery Visitation Policy:  Patients undergoing a surgery or procedure may have one family member or support person with them as long as that person is not COVID-19 positive or experiencing its symptoms.  That person may remain in the waiting area during the procedure and may rotate out with other people.

## 2021-02-07 NOTE — Patient Instructions (Signed)
Your procedure is scheduled on: Report to the Registration Desk on the 1st floor of the [facility]. To find out your arrival time, please call [phone] between 1PM - 3PM on:  REMEMBER: Instructions that are not followed completely may result in serious medical risk, up to and including death; or upon the discretion of your surgeon and anesthesiologist your surgery may need to be rescheduled.  Do not eat food after midnight the night before surgery.  No gum chewing, lozengers or hard candies.  You may however, drink CLEAR liquids up to 2 hours before you are scheduled to arrive for your surgery. Do not drink anything within 2 hours of your scheduled arrival time.  Clear liquids include: - water  - apple juice without pulp - gatorade (not RED, PURPLE, OR BLUE) - black coffee or tea (Do NOT add milk or creamers to the coffee or tea) Do NOT drink anything that is not on this list.  Type 1 and Type 2 diabetics should only drink water.  In addition, your doctor has ordered for you to drink the provided  Ensure Pre-Surgery Clear Carbohydrate Drink  Drinking this carbohydrate drink up to two hours before surgery helps to reduce insulin resistance and improve patient outcomes. Please complete drinking 2 hours prior to scheduled arrival time.  TAKE THESE MEDICATIONS THE MORNING OF SURGERY WITH A SIP OF WATER:  (take one the night before and one on the morning of surgery - helps to prevent nausea after surgery.)  Use inhalers on the day of surgery and bring to the hospital.  **Follow new guidelines for insulin and diabetes medications.**  Follow recommendations from Cardiologist, Pulmonologist or PCP regarding stopping Aspirin, Coumadin, Plavix, Eliquis, Pradaxa, or Pletal.  One week prior to surgery: Stop Anti-inflammatories (NSAIDS) such as Advil, Aleve, Ibuprofen, Motrin, Naproxen, Naprosyn and Aspirin based products such as Excedrin, Goodys Powder, BC Powder. Stop ANY OVER THE  COUNTER supplements until after surgery. You may however, continue to take Tylenol if needed for pain up until the day of surgery.  No Alcohol for 24 hours before or after surgery.  No Smoking including e-cigarettes for 24 hours prior to surgery.  No chewable tobacco products for at least 6 hours prior to surgery.  No nicotine patches on the day of surgery.  Do not use any "recreational" drugs for at least a week prior to your surgery.  Please be advised that the combination of cocaine and anesthesia may have negative outcomes, up to and including death. If you test positive for cocaine, your surgery will be cancelled.  On the morning of surgery brush your teeth with toothpaste and water, you may rinse your mouth with mouthwash if you wish. Do not swallow any toothpaste or mouthwash.  Use CHG Soap or wipes as directed on instruction sheet.  Do not wear jewelry, make-up, hairpins, clips or nail polish.  Do not wear lotions, powders, or perfumes.   Do not shave body from the neck down 48 hours prior to surgery just in case you cut yourself which could leave a site for infection.  Also, freshly shaved skin may become irritated if using the CHG soap.  Contact lenses, hearing aids and dentures may not be worn into surgery.  Do not bring valuables to the hospital. [facility] is not responsible for any missing/lost belongings or valuables.   Total Shoulder Arthroplasty:  use Benzolyl Peroxide 5% Gel as directed on instruction sheet.  Fleets enema or bowel prep as directed.  Bring  your C-PAP to the hospital with you in case you may have to spend the night.   Notify your doctor if there is any change in your medical condition (cold, fever, infection).  Wear comfortable clothing (specific to your surgery type) to the hospital.  After surgery, you can help prevent lung complications by doing breathing exercises.  Take deep breaths and cough every 1-2 hours. Your doctor may order a  device called an Incentive Spirometer to help you take deep breaths. When coughing or sneezing, hold a pillow firmly against your incision with both hands. This is called "splinting." Doing this helps protect your incision. It also decreases belly discomfort.  If you are being admitted to the hospital overnight, leave your suitcase in the car. After surgery it may be brought to your room.  If you are being discharged the day of surgery, you will not be allowed to drive home. You will need a responsible adult (18 years or older) to drive you home and stay with you that night.   If you are taking public transportation, you will need to have a responsible adult (18 years or older) with you. Please confirm with your physician that it is acceptable to use public transportation.   Please call the [place] Dept. at [phone] if you have any questions about these instructions.  Surgery Visitation Policy:  Patients undergoing a surgery or procedure may have one family member or support person with them as long as that person is not COVID-19 positive or experiencing its symptoms.  That person may remain in the waiting area during the procedure and may rotate out with other people.  Inpatient Visitation:    Visiting hours are 7 a.m. to 8 p.m. Up to two visitors ages 16+ are allowed at one time in a patient room. The visitors may rotate out with other people during the day. Visitors must check out when they leave, or other visitors will not be allowed. One designated support person may remain overnight. The visitor must pass COVID-19 screenings, use hand sanitizer when entering and exiting the patient's room and wear a mask at all times, including in the patient's room. Patients must also wear a mask when staff or their visitor are in the room. Masking is required regardless of vaccination status.

## 2021-02-08 ENCOUNTER — Other Ambulatory Visit
Admission: RE | Admit: 2021-02-08 | Discharge: 2021-02-08 | Disposition: A | Discharge status: Home / Self Care | Payer: BC Managed Care – PPO | Source: Ambulatory Visit | Attending: Surgery | Admitting: Surgery

## 2021-02-09 ENCOUNTER — Ambulatory Visit: Payer: BC Managed Care – PPO | Admitting: Anesthesiology

## 2021-02-09 ENCOUNTER — Ambulatory Visit
Admission: RE | Admit: 2021-02-09 | Discharge: 2021-02-09 | Disposition: A | Discharge status: Home / Self Care | Payer: BC Managed Care – PPO | Attending: Surgery | Admitting: Surgery

## 2021-02-09 ENCOUNTER — Encounter: Payer: Self-pay | Admitting: Surgery

## 2021-02-09 ENCOUNTER — Other Ambulatory Visit: Payer: Self-pay

## 2021-02-09 ENCOUNTER — Encounter
Admission: RE | Disposition: A | Discharge status: Home / Self Care | Payer: Self-pay | Source: Home / Self Care | Attending: Surgery

## 2021-02-09 DIAGNOSIS — Z79890 Hormone replacement therapy (postmenopausal): Secondary | ICD-10-CM | POA: Insufficient documentation

## 2021-02-09 DIAGNOSIS — M1711 Unilateral primary osteoarthritis, right knee: Secondary | ICD-10-CM | POA: Insufficient documentation

## 2021-02-09 DIAGNOSIS — M24661 Ankylosis, right knee: Secondary | ICD-10-CM | POA: Diagnosis not present

## 2021-02-09 DIAGNOSIS — Z87891 Personal history of nicotine dependence: Secondary | ICD-10-CM | POA: Diagnosis not present

## 2021-02-09 DIAGNOSIS — M069 Rheumatoid arthritis, unspecified: Secondary | ICD-10-CM | POA: Insufficient documentation

## 2021-02-09 DIAGNOSIS — E039 Hypothyroidism, unspecified: Secondary | ICD-10-CM | POA: Diagnosis not present

## 2021-02-09 DIAGNOSIS — Z79899 Other long term (current) drug therapy: Secondary | ICD-10-CM | POA: Diagnosis not present

## 2021-02-09 DIAGNOSIS — I10 Essential (primary) hypertension: Secondary | ICD-10-CM | POA: Diagnosis not present

## 2021-02-09 DIAGNOSIS — E785 Hyperlipidemia, unspecified: Secondary | ICD-10-CM | POA: Insufficient documentation

## 2021-02-09 DIAGNOSIS — Z791 Long term (current) use of non-steroidal anti-inflammatories (NSAID): Secondary | ICD-10-CM | POA: Insufficient documentation

## 2021-02-09 DIAGNOSIS — Z96653 Presence of artificial knee joint, bilateral: Secondary | ICD-10-CM | POA: Diagnosis not present

## 2021-02-09 DIAGNOSIS — Z885 Allergy status to narcotic agent status: Secondary | ICD-10-CM | POA: Diagnosis not present

## 2021-02-09 HISTORY — PX: KNEE CLOSED REDUCTION: SHX995

## 2021-02-09 SURGERY — MANIPULATION, KNEE, CLOSED
Anesthesia: General | Site: Knee | Laterality: Right

## 2021-02-09 MED ORDER — FENTANYL CITRATE (PF) 100 MCG/2ML IJ SOLN
INTRAMUSCULAR | Status: AC
  Administered 2021-02-09: 50 ug via INTRAVENOUS
  Filled 2021-02-09: qty 2

## 2021-02-09 MED ORDER — SODIUM CHLORIDE 0.9 % IV SOLN: INTRAVENOUS | Status: DC

## 2021-02-09 MED ORDER — FENTANYL CITRATE (PF) 100 MCG/2ML IJ SOLN
INTRAMUSCULAR | Status: DC | PRN
  Administered 2021-02-09: 50 ug via INTRAVENOUS
  Administered 2021-02-09 (×2): 25 ug via INTRAVENOUS

## 2021-02-09 MED ORDER — TRIAMCINOLONE ACETONIDE 40 MG/ML IJ SUSP
INTRAMUSCULAR | Status: AC
  Filled 2021-02-09: qty 1

## 2021-02-09 MED ORDER — METOCLOPRAMIDE HCL 5 MG/ML IJ SOLN: 5.0000 mg | Freq: Three times a day (TID) | INTRAMUSCULAR | Status: DC | PRN

## 2021-02-09 MED ORDER — ONDANSETRON HCL 4 MG/2ML IJ SOLN
INTRAMUSCULAR | Status: AC
  Administered 2021-02-09: 4 mg via INTRAVENOUS
  Filled 2021-02-09: qty 2

## 2021-02-09 MED ORDER — ORAL CARE MOUTH RINSE: 15.0000 mL | Freq: Once | OROMUCOSAL | Status: AC

## 2021-02-09 MED ORDER — CEFAZOLIN SODIUM-DEXTROSE 2-4 GM/100ML-% IV SOLN
INTRAVENOUS | Status: AC
  Filled 2021-02-09: qty 100

## 2021-02-09 MED ORDER — FENTANYL CITRATE (PF) 100 MCG/2ML IJ SOLN
25.0000 ug | INTRAMUSCULAR | Status: DC | PRN
  Administered 2021-02-09: 50 ug via INTRAVENOUS

## 2021-02-09 MED ORDER — MEPERIDINE HCL 25 MG/ML IJ SOLN: 6.2500 mg | INTRAMUSCULAR | Status: DC | PRN

## 2021-02-09 MED ORDER — LACTATED RINGERS IV SOLN: INTRAVENOUS | Status: DC

## 2021-02-09 MED ORDER — PROPOFOL 500 MG/50ML IV EMUL
INTRAVENOUS | Status: AC
  Filled 2021-02-09: qty 50

## 2021-02-09 MED ORDER — OXYCODONE HCL 5 MG PO TABS: 5.0000 mg | ORAL_TABLET | ORAL | Status: DC | PRN

## 2021-02-09 MED ORDER — OXYCODONE HCL 5 MG PO TABS: 5.0000 mg | ORAL_TABLET | ORAL | 0 refills | Status: DC | PRN

## 2021-02-09 MED ORDER — ONDANSETRON HCL 4 MG/2ML IJ SOLN: 4.0000 mg | Freq: Once | INTRAMUSCULAR | Status: AC | PRN

## 2021-02-09 MED ORDER — KETOROLAC TROMETHAMINE 15 MG/ML IJ SOLN: 15.0000 mg | Freq: Once | INTRAMUSCULAR | Status: DC

## 2021-02-09 MED ORDER — ONDANSETRON HCL 4 MG/2ML IJ SOLN: 4.0000 mg | Freq: Four times a day (QID) | INTRAMUSCULAR | Status: DC | PRN

## 2021-02-09 MED ORDER — KETOROLAC TROMETHAMINE 30 MG/ML IJ SOLN
INTRAMUSCULAR | Status: AC
  Filled 2021-02-09: qty 1

## 2021-02-09 MED ORDER — PROPOFOL 500 MG/50ML IV EMUL
INTRAVENOUS | Status: DC | PRN
  Administered 2021-02-09: 140 ug/kg/min via INTRAVENOUS

## 2021-02-09 MED ORDER — CEFAZOLIN SODIUM-DEXTROSE 2-4 GM/100ML-% IV SOLN
2.0000 g | INTRAVENOUS | Status: AC
  Administered 2021-02-09: 2 g via INTRAVENOUS

## 2021-02-09 MED ORDER — BUPIVACAINE HCL (PF) 0.25 % IJ SOLN
INTRAMUSCULAR | Status: DC | PRN
  Administered 2021-02-09: 9 mL

## 2021-02-09 MED ORDER — MIDAZOLAM HCL 2 MG/2ML IJ SOLN
INTRAMUSCULAR | Status: AC
  Filled 2021-02-09: qty 2

## 2021-02-09 MED ORDER — KETOROLAC TROMETHAMINE 30 MG/ML IJ SOLN
INTRAMUSCULAR | Status: DC | PRN
  Administered 2021-02-09: 15 mg via INTRAVENOUS

## 2021-02-09 MED ORDER — OXYCODONE HCL 5 MG PO TABS
ORAL_TABLET | ORAL | Status: AC
  Filled 2021-02-09: qty 1

## 2021-02-09 MED ORDER — PROPOFOL 500 MG/50ML IV EMUL: INTRAVENOUS | Status: DC | PRN

## 2021-02-09 MED ORDER — FENTANYL CITRATE (PF) 100 MCG/2ML IJ SOLN
INTRAMUSCULAR | Status: AC
  Filled 2021-02-09: qty 2

## 2021-02-09 MED ORDER — METOCLOPRAMIDE HCL 10 MG PO TABS: 5.0000 mg | ORAL_TABLET | Freq: Three times a day (TID) | ORAL | Status: DC | PRN

## 2021-02-09 MED ORDER — TRIAMCINOLONE ACETONIDE 40 MG/ML IJ SUSP
INTRAMUSCULAR | Status: DC | PRN
  Administered 2021-02-09: 40 mg via INTRAMUSCULAR

## 2021-02-09 MED ORDER — CHLORHEXIDINE GLUCONATE 0.12 % MT SOLN: 15.0000 mL | Freq: Once | OROMUCOSAL | Status: AC

## 2021-02-09 MED ORDER — ONDANSETRON HCL 4 MG PO TABS: 4.0000 mg | ORAL_TABLET | Freq: Four times a day (QID) | ORAL | Status: DC | PRN

## 2021-02-09 MED ORDER — BUPIVACAINE HCL (PF) 0.25 % IJ SOLN
INTRAMUSCULAR | Status: AC
  Filled 2021-02-09: qty 30

## 2021-02-09 MED ORDER — OXYCODONE HCL 5 MG PO TABS
5.0000 mg | ORAL_TABLET | Freq: Once | ORAL | Status: DC
Start: 2021-02-09 — End: 2021-02-09

## 2021-02-09 MED ORDER — MIDAZOLAM HCL 2 MG/2ML IJ SOLN
INTRAMUSCULAR | Status: DC | PRN
  Administered 2021-02-09: 2 mg via INTRAVENOUS

## 2021-02-09 MED ORDER — PROPOFOL 10 MG/ML IV BOLUS
INTRAVENOUS | Status: DC | PRN
  Administered 2021-02-09: 20 mg via INTRAVENOUS
  Administered 2021-02-09 (×2): 30 mg via INTRAVENOUS
  Administered 2021-02-09: 40 mg via INTRAVENOUS

## 2021-02-09 MED ORDER — CHLORHEXIDINE GLUCONATE 0.12 % MT SOLN
OROMUCOSAL | Status: AC
  Administered 2021-02-09: 15 mL via OROMUCOSAL
  Filled 2021-02-09: qty 15

## 2021-02-09 MED ORDER — PROPOFOL 10 MG/ML IV BOLUS
INTRAVENOUS | Status: AC
  Filled 2021-02-09: qty 20

## 2021-02-09 SURGICAL SUPPLY — 2 items
NEEDLE HYPO 22GX1.5 SAFETY (NEEDLE) ×2 IMPLANT
SYR 10ML LL (SYRINGE) ×2 IMPLANT

## 2021-02-09 NOTE — Op Note (Signed)
02/09/2021  3:13 PM  Patient:   Alejandra Wilson  Pre-Op Diagnosis:   Limited range of motion status post right partial knee replacement.  Post-Op Diagnosis:   Same  Procedure:   Manipulation under anesthesia with steroid injection, right knee.  Surgeon:   Pascal Lux, MD  Anesthesia:   IV sedation  Findings:   As above.  Prior to manipulation, the knee could be ranged from 12 to 75 degrees.  After manipulation, the knee could be extended to within 5 degrees of full extension and flexed to 125 degrees.  Complications:   None  Fluids:   100 cc crystalloid  EBL:   0 cc  UOP:   None  TT:   None  Drains:   None  Closure:   None  Brief Clinical Note:   The patient is a 65 year old female who is now 7 weeks status post a right partial knee replacement.  Despite extensive physical therapy, she continues to have difficulty regaining her range of motion, especially in flexion.  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  She presents at this time for a manipulation under anesthesia with steroid injection of the right knee.  Procedure:   The patient was brought into the operating room and lain in the supine position.  After adequate IV sedation was achieved, a timeout was performed to verify the appropriate surgical site.  The knee was then gently manipulated both in flexion and extension.  Several palpable and audible pops were heard as the knee regained its motion with the findings as described above.  The knee was then injected sterilely using 1 cc of Kenalog-40 (40 mg) and 9 cc of 0.5 to 5% Sensorcaine.  The patient was then awakened and returned to the recovery room in satisfactory condition after tolerating the procedure well.

## 2021-02-09 NOTE — Anesthesia Procedure Notes (Signed)
Procedure Name: MAC Date/Time: 02/09/2021 2:58 PM Performed by: Jerrye Noble, CRNA Pre-anesthesia Checklist: Patient identified, Emergency Drugs available, Suction available and Patient being monitored Patient Re-evaluated:Patient Re-evaluated prior to induction Oxygen Delivery Method: Simple face mask

## 2021-02-09 NOTE — Transfer of Care (Signed)
Immediate Anesthesia Transfer of Care Note  Patient: Alejandra Wilson  Procedure(s) Performed: Right knee manipulation with steroid injection (Right: Knee)  Patient Location: PACU  Anesthesia Type:General  Level of Consciousness: drowsy and patient cooperative  Airway & Oxygen Therapy: Patient Spontanous Breathing and Patient connected to face mask oxygen  Post-op Assessment: Report given to RN and Post -op Vital signs reviewed and stable  Post vital signs: Reviewed  Last Vitals:  Vitals Value Taken Time  BP    Temp    Pulse    Resp    SpO2      Last Pain:  Vitals:   02/09/21 1214  TempSrc: Oral  PainSc: 3          Complications: No notable events documented.

## 2021-02-09 NOTE — H&P (Signed)
History of Present Illness:  Alejandra Wilson is a 65 y.o. female who presents for follow-up now 6 weeks status post a right partial knee replacement. She notes moderate pain in her knee which she rates at 6/10, and for which she has been taking Voltaren twice daily supplemented by Tylenol and/or oxycodone with temporary partial relief of her symptoms. She has been attending physical therapy, as well as perform exercises on her own at home, and notes that she is having difficulty regaining her range of motion. She is able to ambulate full weightbearing on the right knee, but uses a cane for balance and support. She denies any reinjury to the knee, and denies any fevers or chills. She also denies any numbness or paresthesias down her leg to her foot.  Current Outpatient Medications:  acetaminophen (TYLENOL) 500 MG tablet Take 500 mg by mouth as needed for Pain   diclofenac (VOLTAREN) 50 MG EC tablet Take 1 tablet (50 mg total) by mouth 2 (two) times daily with meals 180 tablet 1   hydrOXYchloroQUINE (PLAQUENIL) 200 mg tablet Take 1 tablet (200 mg total) by mouth once daily 90 tablet 1   levothyroxine (SYNTHROID) 100 MCG tablet Take 1 tablet (100 mcg total) by mouth once daily Take on an empty stomach with a glass of water at least 30-60 minutes before breakfast. 90 tablet 3   omeprazole (PRILOSEC) 20 MG DR capsule Take 1 capsule (20 mg total) by mouth once daily 90 capsule 1   oxyCODONE (ROXICODONE) 5 MG immediate release tablet Take 1-2 tablets (5-10 mg total) by mouth every 4 (four) hours as needed for Pain 40 tablet 0   simvastatin (ZOCOR) 20 MG tablet Take 1 tablet (20 mg total) by mouth nightly 90 tablet 1   upadacitinib (RINVOQ) 15 mg Tb24 Take by mouth Take 15 mg by mouth daily.   Allergies:   Codeine Unknown and Nausea   Past Medical History:   Allergic rhinitis   Hyperlipidemia   Hypertension   Hypothyroidism   Rheumatoid arthritis involving multiple sites with positive rheumatoid  factor (Methotrexate. Nodules. Plaquenil. Declines TNF drugs)   Tobacco abuse   Past Surgical History:   Cyst removed from ovary   Left unicondylar knee arthroplasty. Left 07/09/2019 (Dr. Roland Rack)   Right unicondylar knee arthroplasty. Right 12/20/2020 (Dr. Roland Rack)   Family History:   Myocardial Infarction (Heart attack) Father   Diabetes Mother   Heart failure Mother   Coronary Artery Disease (Blocked arteries around heart) Mother   Heart disease Brother   Diabetes Brother   High blood pressure (Hypertension) Brother   Diabetes Brother   Heart disease Brother   High blood pressure (Hypertension) Brother   Social History:   Socioeconomic History:   Marital status: Married  Tobacco Use   Smoking status: Former Smoker  Quit date: 08/22/2014  Years since quitting: 6.4   Smokeless tobacco: Never Used   Tobacco comment: Pt uses Vape occassionally  Substance and Sexual Activity   Alcohol use: No  Alcohol/week: 0.0 standard drinks   Drug use: No   Sexual activity: Defer   Review of Systems:  A comprehensive 14 point ROS was performed, reviewed, and the pertinent orthopaedic findings are documented in the HPI.  Physical Exam: Vitals:  02/03/21 0942  BP: (!) 142/84  Weight: 85.1 kg (187 lb 9.6 oz)  Height: 160 cm (5\' 3" )  PainSc: 6  PainLoc: Knee   General/Constitutional: The patient appears to be well-nourished, well-developed, and in no acute distress.  Neuro/Psych: Normal mood and affect, oriented to person, place and time. Eyes: Non-icteric. Pupils are equal, round, and reactive to light, and exhibit synchronous movement. ENT: Unremarkable. Lymphatic: No palpable adenopathy. Respiratory: Lungs clear to auscultation, Normal chest excursion, No wheezes and Non-labored breathing Cardiovascular: Regular rate and rhythm. No murmurs. and No edema, swelling or tenderness, except as noted in detailed exam. Integumentary: No impressive skin lesions present, except as noted in  detailed exam. Musculoskeletal: Unremarkable, except as noted in detailed exam.  Right knee exam: The patient ambulates with a moderate limp, favoring her right leg, and uses a cane for balance and support. Skin inspection of the right knee is notable for a well-healed surgical incision which is without evidence for infection. There is perhaps mild swelling around the knee, as well as a small effusion, but no erythema, ecchymosis, abrasions, warmth, or other skin abnormalities are identified. She has no tenderness to palpation over the medial or lateral aspects of the knee, nor is there any tenderness anteriorly. Actively, she lacks 25 degrees of extension but can flex her knee to 80 degrees. Passively, the knee can be extended to 20 degrees and flexed to 90 degrees. Her patella tracks well and is without crepitance. The knee is stable to varus and valgus stressing. She is neurovascularly intact to the right lower extremity and foot.  X-rays/MRI/Lab data:  Standing AP and lateral x-rays of the right knee, as well as a sunrise view, are obtained. These films demonstrate excellent position of the femoral and tibial components which are without evidence for infection. The meniscal bearing appears to be appropriately positioned. No new acute bony abnormalities are identified.  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  Status post right partial knee replacement   Primary osteoarthritis of right knee   Plan: The treatment options were discussed with the patient. In addition, patient educational materials were provided regarding the diagnosis and treatment options. The patient is frustrated by her continued symptoms and functional limitations, primarily due to the lack of motion in her knee. Therefore, I have recommended a surgical procedure, specifically a manipulation under anesthesia with steroid injection of her right knee. The procedure was discussed with the patient,  as were the potential risks (including bleeding, infection, nerve and/or blood vessel injury, persistent or recurrent pain/stiffness, fracture, need for further surgery, blood clots, strokes, heart attacks and/or arhythmias, pneumonia, etc.) and benefits. The patient states his/her understanding and wishes to proceed. All of the patient's questions and concerns were answered. She can call any time with further concerns. She will follow up post-surgery, routine.   H&P reviewed and patient re-examined. No changes.

## 2021-02-09 NOTE — Discharge Instructions (Addendum)
Orthopedic discharge instructions: May shower tonight. Apply ice frequently to knee. Take Voltaren 50 mg BID OR ibuprofen 600-800 mg TID with meals for 7-10 days, then as necessary. May have at 9:00 Take pain medication as prescribed or ES Tylenol when needed. No more than 4000mg  in 24 hours May weight-bear as tolerated - use crutches or walker as needed. Start physical therapy tomorrow as scheduled. Follow-up in 10-14 days or as scheduled.  AMBULATORY SURGERY  DISCHARGE INSTRUCTIONS   The drugs that you were given will stay in your system until tomorrow so for the next 24 hours you should not:  Drive an automobile Make any legal decisions Drink any alcoholic beverage   You may resume regular meals tomorrow.  Today it is better to start with liquids and gradually work up to solid foods.  You may eat anything you prefer, but it is better to start with liquids, then soup and crackers, and gradually work up to solid foods.   Please notify your doctor immediately if you have any unusual bleeding, trouble breathing, redness and pain at the surgery site, drainage, fever, or pain not relieved by medication.    Additional Instructions:     Please contact your physician with any problems or Same Day Surgery at [phone], Monday through Friday 6 am to 4 pm, or [place] [facility] number at [phone].

## 2021-02-09 NOTE — Anesthesia Preprocedure Evaluation (Signed)
Anesthesia Evaluation  Patient identified by MRN, date of birth, ID band Patient awake    Reviewed: Allergy & Precautions, NPO status , Patient's Chart, lab work & pertinent test results  History of Anesthesia Complications Negative for: history of anesthetic complications  Airway Mallampati: II  TM Distance: >3 FB Neck ROM: Full    Dental  (+) Poor Dentition, Missing, Partial Upper   Pulmonary neg sleep apnea, neg COPD, Current Smoker (ecigarettes)Patient did not abstain from smoking., former smoker,    breath sounds clear to auscultation- rhonchi (-) wheezing      Cardiovascular Exercise Tolerance: Good (-) hypertension(-) CAD, (-) Past MI, (-) Cardiac Stents and (-) CABG  Rhythm:Regular Rate:Normal - Systolic murmurs and - Diastolic murmurs    Neuro/Psych neg Seizures negative neurological ROS  negative psych ROS   GI/Hepatic Neg liver ROS, GERD  ,  Endo/Other  neg diabetesHypothyroidism   Renal/GU negative Renal ROS     Musculoskeletal  (+) Arthritis ,   Abdominal (+) + obese,   Peds  Hematology negative hematology ROS (+)   Anesthesia Other Findings Past Medical History: No date: GERD (gastroesophageal reflux disease) No date: Hyperlipidemia No date: Hypothyroidism No date: Insomnia No date: Osteoarthritis of right knee No date: Rheumatoid arthritis involving multiple sites with positive  rheumatoid factor (HCC)   Reproductive/Obstetrics                            Anesthesia Physical  Anesthesia Plan  ASA: 3  Anesthesia Plan: General   Post-op Pain Management:    Induction: Intravenous  PONV Risk Score and Plan: 2 and Midazolam and Propofol infusion  Airway Management Planned: Natural Airway and Nasal Cannula  Additional Equipment:   Intra-op Plan:   Post-operative Plan:   Informed Consent: I have reviewed the patients History and Physical, chart, labs and  discussed the procedure including the risks, benefits and alternatives for the proposed anesthesia with the patient or authorized representative who has indicated his/her understanding and acceptance.     Dental advisory given  Plan Discussed with: CRNA and Anesthesiologist  Anesthesia Plan Comments: (Pt refused spinal, proceeding with GA)       Anesthesia Quick Evaluation

## 2021-02-10 ENCOUNTER — Encounter: Payer: Self-pay | Admitting: Surgery

## 2021-02-13 NOTE — Anesthesia Postprocedure Evaluation (Signed)
Anesthesia Post Note  Patient: Alejandra Wilson  Procedure(s) Performed: Right knee manipulation with steroid injection (Right: Knee)  Patient location during evaluation: PACU Anesthesia Type: General Level of consciousness: awake and alert and oriented Pain management: pain level controlled Vital Signs Assessment: post-procedure vital signs reviewed and stable Respiratory status: spontaneous breathing, nonlabored ventilation and respiratory function stable Cardiovascular status: blood pressure returned to baseline and stable Postop Assessment: no signs of nausea or vomiting Anesthetic complications: no   No notable events documented.   Last Vitals:  Vitals:   02/09/21 1625 02/09/21 1636  BP: (!) 158/72 (!) 173/75  Pulse: 67 60  Resp: 16 16  Temp: 36.6 C (!) 36.1 C  SpO2: 98% 100%    Last Pain:  Vitals:   02/09/21 1636  TempSrc: Temporal  PainSc: 4                  Kaily Wragg

## 2021-07-03 ENCOUNTER — Other Ambulatory Visit: Payer: Self-pay | Admitting: Physician Assistant

## 2021-07-03 DIAGNOSIS — Z1231 Encounter for screening mammogram for malignant neoplasm of breast: Secondary | ICD-10-CM

## 2021-07-04 ENCOUNTER — Other Ambulatory Visit: Payer: Self-pay

## 2021-07-04 ENCOUNTER — Ambulatory Visit
Admission: RE | Admit: 2021-07-04 | Discharge: 2021-07-04 | Disposition: A | Discharge status: Home / Self Care | Payer: BC Managed Care – PPO | Source: Ambulatory Visit | Attending: Physician Assistant | Admitting: Physician Assistant

## 2021-07-04 DIAGNOSIS — Z1231 Encounter for screening mammogram for malignant neoplasm of breast: Secondary | ICD-10-CM | POA: Insufficient documentation

## 2021-08-21 ENCOUNTER — Ambulatory Visit: Payer: BC Managed Care – PPO | Admitting: Certified Registered Nurse Anesthetist

## 2021-08-21 ENCOUNTER — Encounter
Admission: RE | Disposition: A | Discharge status: Home / Self Care | Payer: Self-pay | Source: Ambulatory Visit | Attending: Gastroenterology

## 2021-08-21 ENCOUNTER — Ambulatory Visit
Admission: RE | Admit: 2021-08-21 | Discharge: 2021-08-21 | Disposition: A | Discharge status: Home / Self Care | Payer: BC Managed Care – PPO | Source: Ambulatory Visit | Attending: Gastroenterology | Admitting: Gastroenterology

## 2021-08-21 DIAGNOSIS — M069 Rheumatoid arthritis, unspecified: Secondary | ICD-10-CM | POA: Diagnosis not present

## 2021-08-21 DIAGNOSIS — C19 Malignant neoplasm of rectosigmoid junction: Secondary | ICD-10-CM | POA: Diagnosis not present

## 2021-08-21 DIAGNOSIS — E785 Hyperlipidemia, unspecified: Secondary | ICD-10-CM | POA: Diagnosis not present

## 2021-08-21 DIAGNOSIS — K219 Gastro-esophageal reflux disease without esophagitis: Secondary | ICD-10-CM | POA: Insufficient documentation

## 2021-08-21 DIAGNOSIS — K621 Rectal polyp: Secondary | ICD-10-CM | POA: Diagnosis not present

## 2021-08-21 DIAGNOSIS — Z87891 Personal history of nicotine dependence: Secondary | ICD-10-CM | POA: Insufficient documentation

## 2021-08-21 DIAGNOSIS — E039 Hypothyroidism, unspecified: Secondary | ICD-10-CM | POA: Diagnosis not present

## 2021-08-21 DIAGNOSIS — M1711 Unilateral primary osteoarthritis, right knee: Secondary | ICD-10-CM | POA: Insufficient documentation

## 2021-08-21 DIAGNOSIS — K625 Hemorrhage of anus and rectum: Secondary | ICD-10-CM | POA: Insufficient documentation

## 2021-08-21 DIAGNOSIS — K640 First degree hemorrhoids: Secondary | ICD-10-CM | POA: Diagnosis not present

## 2021-08-21 HISTORY — PX: COLONOSCOPY WITH PROPOFOL: SHX5780

## 2021-08-21 SURGERY — COLONOSCOPY WITH PROPOFOL
Anesthesia: General

## 2021-08-21 MED ORDER — PROPOFOL 10 MG/ML IV BOLUS
INTRAVENOUS | Status: DC | PRN
  Administered 2021-08-21: 80 mg via INTRAVENOUS
  Administered 2021-08-21: 20 mg via INTRAVENOUS

## 2021-08-21 MED ORDER — ONDANSETRON HCL 4 MG/2ML IJ SOLN
INTRAMUSCULAR | Status: DC | PRN
  Administered 2021-08-21: 4 mg via INTRAVENOUS

## 2021-08-21 MED ORDER — LIDOCAINE HCL (CARDIAC) PF 100 MG/5ML IV SOSY
PREFILLED_SYRINGE | INTRAVENOUS | Status: DC | PRN
  Administered 2021-08-21: 50 mg via INTRAVENOUS

## 2021-08-21 MED ORDER — PROPOFOL 500 MG/50ML IV EMUL
INTRAVENOUS | Status: DC | PRN
  Administered 2021-08-21: 150 ug/kg/min via INTRAVENOUS

## 2021-08-21 MED ORDER — SODIUM CHLORIDE 0.9 % IV SOLN: INTRAVENOUS | Status: DC

## 2021-08-21 NOTE — Interval H&P Note (Signed)
History and Physical Interval Note: ? ?08/21/2021 ?9:23 AM ? ?Alejandra Wilson  has presented today for surgery, with the diagnosis of Rectal Bleeding.  The various methods of treatment have been discussed with the patient and family. After consideration of risks, benefits and other options for treatment, the patient has consented to  Procedure(s): ?COLONOSCOPY WITH PROPOFOL (N/A) as a surgical intervention.  The patient's history has been reviewed, patient examined, no change in status, stable for surgery.  I have reviewed the patient's chart and labs.  Questions were answered to the patient's satisfaction.   ? ? ?Hilton Cork Amarilys Lyles ? ?Ok to proceed with colonoscopy ?

## 2021-08-21 NOTE — OR Nursing (Signed)
1 1/2 ml of Spot Tatto injected into Rectro-Sigmoid Mass ?

## 2021-08-21 NOTE — Anesthesia Postprocedure Evaluation (Signed)
Anesthesia Post Note ? ?Patient: Alejandra Wilson ? ?Procedure(s) Performed: COLONOSCOPY WITH PROPOFOL ? ?Patient location during evaluation: Endoscopy ?Anesthesia Type: General ?Level of consciousness: awake and alert ?Pain management: pain level controlled ?Vital Signs Assessment: post-procedure vital signs reviewed and stable ?Respiratory status: spontaneous breathing, nonlabored ventilation, respiratory function stable and patient connected to nasal cannula oxygen ?Cardiovascular status: blood pressure returned to baseline and stable ?Postop Assessment: no apparent nausea or vomiting ?Anesthetic complications: no ? ? ?No notable events documented. ? ? ?Last Vitals:  ?Vitals:  ? 08/21/21 1011 08/21/21 1021  ?BP: 139/77 (!) 152/81  ?Pulse: 61 62  ?Resp: 18 15  ?Temp:    ?SpO2: 100% 100%  ?  ?Last Pain:  ?Vitals:  ? 08/21/21 1021  ?TempSrc:   ?PainSc: 0-No pain  ? ? ?  ?  ?  ?  ?  ?  ? ?Arita Miss ? ? ? ? ?

## 2021-08-21 NOTE — Op Note (Signed)
[facility] ?Gastroenterology ?Patient Name: Alejandra Wilson ?Procedure Date: 08/21/2021 9:19 AM ?MRN: [id number] ?Account #: [id number] ?Date of Birth: [birth year] ?Admit Type: Outpatient ?Age: 66 ?Room: [facility] ENDO ROOM 3 ?Gender: Female ?Note Status: Finalized ?Instrument Name: Colonscope [id number] ?Procedure:             Colonoscopy ?Indications:           Rectal bleeding ?Providers:             Andrey Farmer MD, MD ?Referring MD:          Precious Bard, MD (Referring MD) ?Medicines:             Monitored Anesthesia Care ?Complications:         No immediate complications. Estimated blood loss:  ?                       Minimal. ?Procedure:             Pre-Anesthesia Assessment: ?                       - Prior to the procedure, a History and Physical was  ?                       performed, and patient medications and allergies were  ?                       reviewed. The patient is competent. The risks and  ?                       benefits of the procedure and the sedation options and  ?                       risks were discussed with the patient. All questions  ?                       were answered and informed consent was obtained.  ?                       Patient identification and proposed procedure were  ?                       verified by the physician, the nurse, the  ?                       anesthesiologist, the anesthetist and the technician  ?                       in the endoscopy suite. Mental Status Examination:  ?                       alert and oriented. Airway Examination: normal  ?                       oropharyngeal airway and neck mobility. Respiratory  ?                       Examination: clear to auscultation. CV Examination:  ?  normal. Prophylactic Antibiotics: The patient does not  ?                       require prophylactic antibiotics. Prior  ?                       Anticoagulants: The patient has taken no previous  ?                        anticoagulant or antiplatelet agents. ASA Grade  ?                       Assessment: II - A patient with mild systemic disease.  ?                       After reviewing the risks and benefits, the patient  ?                       was deemed in satisfactory condition to undergo the  ?                       procedure. The anesthesia plan was to use monitored  ?                       anesthesia care (MAC). Immediately prior to  ?                       administration of medications, the patient was  ?                       re-assessed for adequacy to receive sedatives. The  ?                       heart rate, respiratory rate, oxygen saturations,  ?                       blood pressure, adequacy of pulmonary ventilation, and  ?                       response to care were monitored throughout the  ?                       procedure. The physical status of the patient was  ?                       re-assessed after the procedure. ?                       After obtaining informed consent, the colonoscope was  ?                       passed under direct vision. Throughout the procedure,  ?                       the patient's blood pressure, pulse, and oxygen  ?                       saturations were monitored continuously. The  ?  Colonoscope was introduced through the anus and  ?                       advanced to the the terminal ileum. The colonoscopy  ?                       was performed without difficulty. The patient  ?                       tolerated the procedure well. The quality of the bowel  ?                       preparation was good. ?Findings: ?     The perianal and digital rectal examinations were normal. ?     The terminal ileum appeared normal. ?     A 22 mm polypoid lesion was found in the recto-sigmoid colon. The lesion  ?     was ulcerated. This was about 18 cm from the anal verge. No bleeding was  ?     present. This was biopsied with a cold forceps for histology. Area was  ?      tattooed with an injection of 1 mL of [place] ink. ?     Internal hemorrhoids were found during endoscopy. The hemorrhoids were  ?     Grade I (internal hemorrhoids that do not prolapse). ?     Retroflexion in the rectum was not performed. ?Impression:            - The examined portion of the ileum was normal. ?                       - Polypoid lesion in the recto-sigmoid colon.  ?                       Biopsied. Tattooed. ?                       - Internal hemorrhoids. ?Recommendation:        - Discharge patient to home. ?                       - Resume previous diet. ?                       - Continue present medications. ?                       - Await pathology results. ?Procedure Code(s):     --- Professional --- ?                       [phone], Colonoscopy, flexible; with directed submucosal  ?                       injection(s), any substance ?                       [id number], Colonoscopy, flexible; with biopsy, single or  ?                       multiple ?Diagnosis Code(s):     --- Professional --- ?  K64.0, First degree hemorrhoids ?                       D49.0, Neoplasm of unspecified behavior of digestive  ?                       system ?                       K62.5, Hemorrhage of anus and rectum ?CPT copyright 2019 [employer]. All rights reserved. ?The codes documented in this report are preliminary and upon coder review may  ?be revised to meet current compliance requirements. ?Andrey Farmer MD, MD ?08/21/2021 10:04:37 AM ?Number of Addenda: 0 ?Note Initiated On: 08/21/2021 9:19 AM ?Scope Withdrawal Time: 0 hours 21 minutes 0 seconds  ?Total Procedure Duration: 0 hours 24 minutes 18 seconds  ?Estimated Blood Loss:  Estimated blood loss was minimal. ?     [facility] ?

## 2021-08-21 NOTE — Anesthesia Preprocedure Evaluation (Signed)
Anesthesia Evaluation  ?Patient identified by MRN, date of birth, ID band ?Patient awake ? ? ? ?Reviewed: ?Allergy & Precautions, NPO status , Patient's Chart, lab work & pertinent test results ? ?History of Anesthesia Complications ?Negative for: history of anesthetic complications ? ?Airway ?Mallampati: II ? ?TM Distance: >3 FB ?Neck ROM: Full ? ? ? Dental ? ?(+) Partial Lower, Partial Upper ?  ?Pulmonary ?neg pulmonary ROS, neg sleep apnea, neg COPD, Patient abstained from smoking.Not current smoker, former smoker,  ?  ?Pulmonary exam normal ?breath sounds clear to auscultation ? ? ? ? ? ? Cardiovascular ?Exercise Tolerance: Good ?METS(-) hypertension(-) CAD and (-) Past MI negative cardio ROS ? ?(-) dysrhythmias  ?Rhythm:Regular Rate:Normal ?- Systolic murmurs ? ?  ?Neuro/Psych ?negative neurological ROS ? negative psych ROS  ? GI/Hepatic ?GERD  Medicated and Controlled,(+)  ?  ? (-) substance abuse ? ,   ?Endo/Other  ?neg diabetesHypothyroidism  ? Renal/GU ?negative Renal ROS  ? ?  ?Musculoskeletal ? ? Abdominal ?  ?Peds ? Hematology ?  ?Anesthesia Other Findings ?Past Medical History: ?No date: GERD (gastroesophageal reflux disease) ?No date: Hyperlipidemia ?No date: Hypothyroidism ?No date: Insomnia ?No date: Osteoarthritis of right knee ?No date: Rheumatoid arthritis involving multiple sites with positive  ?rheumatoid factor ([place]) ? Reproductive/Obstetrics ? ?  ? ? ? ? ? ? ? ? ? ? ? ? ? ?  ?  ? ? ? ? ? ? ? ? ?Anesthesia Physical ?Anesthesia Plan ? ?ASA: 2 ? ?Anesthesia Plan: General  ? ?Post-op Pain Management: Minimal or no pain anticipated  ? ?Induction: Intravenous ? ?PONV Risk Score and Plan: 3 and Propofol infusion, TIVA and Ondansetron ? ?Airway Management Planned: Nasal Cannula ? ?Additional Equipment: None ? ?Intra-op Plan:  ? ?Post-operative Plan:  ? ?Informed Consent: I have reviewed the patients History and Physical, chart, labs and discussed the procedure  including the risks, benefits and alternatives for the proposed anesthesia with the patient or authorized representative who has indicated his/her understanding and acceptance.  ? ? ? ?Dental advisory given ? ?Plan Discussed with: CRNA and Surgeon ? ?Anesthesia Plan Comments: (Discussed risks of anesthesia with patient, including possibility of difficulty with spontaneous ventilation under anesthesia necessitating airway intervention, PONV, and rare risks such as cardiac or respiratory or neurological events, and allergic reactions. Discussed the role of CRNA in patient's perioperative care. Patient understands.)  ? ? ? ? ? ? ?Anesthesia Quick Evaluation ? ?

## 2021-08-21 NOTE — H&P (Signed)
Outpatient short stay form Pre-procedure ?08/21/2021  ?Lesly Rubenstein, MD ? ?Primary Physician: Marinda Elk, MD ? ?Reason for visit:  Rectal Bleeding ? ?History of present illness:   ?66 y/o lady with history of rheumatoid arthritis here for colonoscopy due to rectal bleeding. Patient has never had a colonoscopy before. No blood thinners. No family history of Gi malignancies. No significant abdominal surgeries. ? ? ? ?Current Facility-Administered Medications:  ?  0.9 %  sodium chloride infusion, , Intravenous, Continuous, Keely Drennan, Hilton Cork, MD, Last Rate: 20 mL/hr at 08/21/21 0919, New Bag at 08/21/21 0919 ? ?Medications Prior to Admission  ?Medication Sig Dispense Refill Last Dose  ? acetaminophen (TYLENOL) 500 MG tablet Take 500-1,000 mg by mouth every 6 (six) hours as needed (pain.).   Past Week  ? diclofenac (VOLTAREN) 50 MG EC tablet Take 50 mg by mouth 2 (two) times daily.   08/20/2021 at 0800  ? EUTHYROX 100 MCG tablet Take 100 mcg by mouth daily before breakfast.   08/21/2021 at 0500  ? hydroxychloroquine (PLAQUENIL) 200 MG tablet Take 200 mg by mouth at bedtime.   Past Week at 0800  ? omeprazole (PRILOSEC) 20 MG capsule Take 20 mg by mouth in the morning.   08/20/2021 at 0800  ? RINVOQ 15 MG TB24 Take 15 mg by mouth in the morning.   08/20/2021  ? simvastatin (ZOCOR) 20 MG tablet Take 20 mg by mouth at bedtime.   08/20/2021  ? oxyCODONE (ROXICODONE) 5 MG immediate release tablet Take 1-2 tablets (5-10 mg total) by mouth every 4 (four) hours as needed ((pain) before therapy appointments). 30 tablet 0   ? ? ? ?Allergies  ?Allergen Reactions  ? Codeine Nausea Only  ? ? ? ?Past Medical History:  ?Diagnosis Date  ? GERD (gastroesophageal reflux disease)   ? Hyperlipidemia   ? Hypothyroidism   ? Insomnia   ? Osteoarthritis of right knee   ? Rheumatoid arthritis involving multiple sites with positive rheumatoid factor ([place])   ? ? ?Review of systems:  Otherwise negative.  ? ? ?Physical Exam ? ?Gen: Alert,  oriented. Appears stated age.  ?HEENT: PERRLA. ?Lungs: No respiratory distress ?CV: RRR ?Abd: soft, benign, no masses ?Ext: No edema ? ? ? ?Planned procedures: Proceed with colonoscopy. The patient understands the nature of the planned procedure, indications, risks, alternatives and potential complications including but not limited to bleeding, infection, perforation, damage to internal organs and possible oversedation/side effects from anesthesia. The patient agrees and gives consent to proceed.  ?Please refer to procedure notes for findings, recommendations and patient disposition/instructions.  ? ? ? ?Lesly Rubenstein, MD ?[facility] ? ? ? ?  ? ?

## 2021-08-21 NOTE — Transfer of Care (Signed)
Immediate Anesthesia Transfer of Care Note ? ?Patient: Alejandra Wilson ? ?Procedure(s) Performed: COLONOSCOPY WITH PROPOFOL ? ?Patient Location: Endoscopy Unit ? ?Anesthesia Type:General ? ?Level of Consciousness: awake, alert  and oriented ? ?Airway & Oxygen Therapy: Patient Spontanous Breathing ? ?Post-op Assessment: Report given to RN and Post -op Vital signs reviewed and stable ? ?Post vital signs: Reviewed and stable ? ?Last Vitals:  ?Vitals Value Taken Time  ?BP 125/75 08/21/21 1001  ?Temp    ?Pulse 73 08/21/21 1001  ?Resp 17 08/21/21 1001  ?SpO2 100 % 08/21/21 1001  ?Vitals shown include unvalidated device data. ? ?Last Pain:  ?Vitals:  ? 08/21/21 0859  ?TempSrc: Temporal  ?   ? ?  ? ?Complications: No notable events documented. ?

## 2021-08-21 NOTE — Anesthesia Procedure Notes (Signed)
Procedure Name: [place] ?Date/Time: 08/21/2021 9:19 AM ?Performed by: Tollie Eth, CRNA ?Pre-anesthesia Checklist: Patient identified, Emergency Drugs available, Suction available and Patient being monitored ?Patient Re-evaluated:Patient Re-evaluated prior to induction ?Oxygen Delivery Method: Nasal cannula ?Induction Type: IV induction ?Placement Confirmation: positive ETCO2 ? ? ? ? ?

## 2021-08-22 ENCOUNTER — Encounter: Payer: Self-pay | Admitting: Gastroenterology

## 2021-08-22 LAB — SURGICAL PATHOLOGY

## 2021-08-23 ENCOUNTER — Inpatient Hospital Stay: Payer: BC Managed Care – PPO | Attending: Oncology | Admitting: Oncology

## 2021-08-23 ENCOUNTER — Encounter: Payer: Self-pay | Admitting: Oncology

## 2021-08-23 ENCOUNTER — Inpatient Hospital Stay: Payer: BC Managed Care – PPO

## 2021-08-23 VITALS — BP 158/74 | HR 69 | Temp 98.7°F | Resp 18 | Ht 64.0 in | Wt 195.8 lb

## 2021-08-23 DIAGNOSIS — C19 Malignant neoplasm of rectosigmoid junction: Secondary | ICD-10-CM | POA: Diagnosis not present

## 2021-08-23 DIAGNOSIS — E785 Hyperlipidemia, unspecified: Secondary | ICD-10-CM | POA: Insufficient documentation

## 2021-08-23 DIAGNOSIS — E039 Hypothyroidism, unspecified: Secondary | ICD-10-CM | POA: Insufficient documentation

## 2021-08-23 DIAGNOSIS — J309 Allergic rhinitis, unspecified: Secondary | ICD-10-CM | POA: Insufficient documentation

## 2021-08-23 DIAGNOSIS — C187 Malignant neoplasm of sigmoid colon: Secondary | ICD-10-CM

## 2021-08-23 DIAGNOSIS — Z7189 Other specified counseling: Secondary | ICD-10-CM

## 2021-08-23 DIAGNOSIS — Z87891 Personal history of nicotine dependence: Secondary | ICD-10-CM | POA: Diagnosis not present

## 2021-08-23 LAB — CBC WITH DIFFERENTIAL/PLATELET
Abs Immature Granulocytes: 0.01 10*3/uL (ref 0.00–0.07)
Basophils Absolute: 0.1 10*3/uL (ref 0.0–0.1)
Basophils Relative: 1 %
Eosinophils Absolute: 0.1 10*3/uL (ref 0.0–0.5)
Eosinophils Relative: 1 %
HCT: 38.3 % (ref 36.0–46.0)
Hemoglobin: 13 g/dL (ref 12.0–15.0)
Immature Granulocytes: 0 %
Lymphocytes Relative: 25 %
Lymphs Abs: 1.4 10*3/uL (ref 0.7–4.0)
MCH: 29.8 pg (ref 26.0–34.0)
MCHC: 33.9 g/dL (ref 30.0–36.0)
MCV: 87.8 fL (ref 80.0–100.0)
Monocytes Absolute: 0.6 10*3/uL (ref 0.1–1.0)
Monocytes Relative: 11 %
Neutro Abs: 3.5 10*3/uL (ref 1.7–7.7)
Neutrophils Relative %: 62 %
Platelets: 363 10*3/uL (ref 150–400)
RBC: 4.36 MIL/uL (ref 3.87–5.11)
RDW: 12.9 % (ref 11.5–15.5)
WBC: 5.7 10*3/uL (ref 4.0–10.5)
nRBC: 0 % (ref 0.0–0.2)

## 2021-08-23 LAB — COMPREHENSIVE METABOLIC PANEL
ALT: 31 U/L (ref 0–44)
AST: 27 U/L (ref 15–41)
Albumin: 4.7 g/dL (ref 3.5–5.0)
Alkaline Phosphatase: 88 U/L (ref 38–126)
Anion gap: 7 (ref 5–15)
BUN: 13 mg/dL (ref 8–23)
CO2: 26 mmol/L (ref 22–32)
Calcium: 9.2 mg/dL (ref 8.9–10.3)
Chloride: 106 mmol/L (ref 98–111)
Creatinine, Ser: 1 mg/dL (ref 0.44–1.00)
GFR, Estimated: >60 mL/min (ref >60)
Glucose, Bld: 107 mg/dL — ABNORMAL HIGH (ref 70–99)
Potassium: 3.9 mmol/L (ref 3.5–5.1)
Sodium: 139 mmol/L (ref 135–145)
Total Bilirubin: 1 mg/dL (ref 0.3–1.2)
Total Protein: 7.9 g/dL (ref 6.5–8.1)

## 2021-08-23 LAB — IRON AND TIBC
Iron: 70 ug/dL (ref 28–170)
Saturation Ratios: 17 % (ref 10.4–31.8)
TIBC: 402 ug/dL (ref 250–450)
UIBC: 332 ug/dL

## 2021-08-23 LAB — FERRITIN: Ferritin: 32 ng/mL (ref 11–307)

## 2021-08-23 NOTE — Progress Notes (Signed)
? ?Hematology/Oncology Consult note ?[facility] ?[phone] [phone] ? ?Patient Care Team: ?Marinda Elk, MD as PCP - General (Physician Assistant)  ? ?Name of the patient: Alejandra Wilson  ?[id number]  ?09/20/1955  ? ? 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  ?Referring physician-Dr. Haig Prophet ? ?Date of visit: 08/23/21 ? ? ?History of presenting illness- patient is a 66 year old female who never had a colonoscopy but noticed rectal bleeding sometime in November 2022.  This was followed by colonoscopy on 08/21/2021 which showed a 22 mm polypoid lesion in the rectosigmoid colon roughly 18 cm from the anal verge.  This was biopsied and was consistent with moderately differentiated adenocarcinoma.  Patient has been referred for further management.  No family history of colon, breast or pancreatic cancer.  Patient reports mild intermittent rectal bleeding with a neither complaints at this time.  Bowel meds are otherwise regular.  She denies any abdominal pain or changes in her appetite or weight. ? ?ECOG PS- 0 ? ?Pain scale- 0 ? ? ?Review of systems- Review of Systems  ?Constitutional:  Negative for chills, fever, malaise/fatigue and weight loss.  ?HENT:  Negative for congestion, ear discharge and nosebleeds.   ?Eyes:  Negative for blurred vision.  ?Respiratory:  Negative for cough, hemoptysis, sputum production, shortness of breath and wheezing.   ?Cardiovascular:  Negative for chest pain, palpitations, orthopnea and claudication.  ?Gastrointestinal:  Positive for blood in stool. Negative for abdominal pain, constipation, diarrhea, heartburn, melena, nausea and vomiting.  ?Genitourinary:  Negative for dysuria, flank pain, frequency, hematuria and urgency.  ?Musculoskeletal:  Negative for back pain, joint pain and myalgias.  ?Skin:  Negative for rash.  ?Neurological:  Negative for dizziness, tingling, focal weakness, seizures, weakness and headaches.   ?Endo/Heme/Allergies:  Does not bruise/bleed easily.  ?Psychiatric/Behavioral:  Negative for depression and suicidal ideas. The patient does not have insomnia.   ? ?Allergies  ?Allergen Reactions  ? Codeine Nausea Only  ? ? ?Patient Active Problem List  ? Diagnosis Date Noted  ? Allergic rhinitis 08/23/2021  ? Hyperlipidemia 08/23/2021  ? Hypothyroidism 08/23/2021  ? 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 ? ? ?Past Medical History:  ?Diagnosis Date  ? GERD (gastroesophageal reflux disease)   ? Hyperlipidemia   ? Hypothyroidism   ? Insomnia   ? Osteoarthritis of right knee   ? Rheumatoid arthritis involving multiple sites with positive rheumatoid factor ([place])   ? ? ? ?Past Surgical History:  ?Procedure Laterality Date  ? COLONOSCOPY WITH PROPOFOL N/A 08/21/2021  ? Procedure: COLONOSCOPY WITH PROPOFOL;  Surgeon: Lesly Rubenstein, MD;  Location: [facility];  Service: Endoscopy;  Laterality: N/A;  ? KNEE CLOSED REDUCTION Right 02/09/2021  ? Procedure: Right knee manipulation with steroid injection;  Surgeon: Corky Mull, MD;  Location: [facility] ORS;  Service: Orthopedics;  Laterality: Right;  ? OVARIAN CYST REMOVAL    ? PARTIAL KNEE ARTHROPLASTY Left 07/09/2019  ? Procedure: UNICOMPARTMENTAL KNEE;  Surgeon: Corky Mull, MD;  Location: [facility] ORS;  Service: Orthopedics;  Laterality: Left;  ? PARTIAL KNEE ARTHROPLASTY Right 12/20/2020  ? Procedure: RIGHT PARTIAL KNEE REPLACEMENT;  Surgeon: Corky Mull, MD;  Location: [facility] ORS;  Service: Orthopedics;  Laterality: Right;  ? ? ?Social History  ? ?Socioeconomic History  ? Marital status: Married  ?  Spouse name: [name]  ? Number of children: 2  ? Years of education: Not on file  ? Highest education level: Not on file  ?Occupational History  ?  Not on file  ?Tobacco Use  ? Smoking status: Former  ?  Types: Cigarettes  ? Smokeless tobacco: Never  ?Vaping Use  ? Vaping Use: Some days  ? Substances: Nicotine  ?Substance and Sexual Activity  ? Alcohol use: No  ? Drug use: Never  ?  Sexual activity: Not on file  ?Other Topics Concern  ? Not on file  ?Social History Narrative  ? Not on file  ? ?Social Determinants of Health  ? ?Financial Resource Strain: Not on file  ?Food Insecurity: Not on file  ?Transportation Needs: Not on file  ?Physical Activity: Not on file  ?Stress: Not on file  ?Social Connections: Not on file  ?Intimate Partner Violence: Not on file  ? ?  ?Family History  ?Problem Relation Age of Onset  ? Diabetes Mother   ? Heart disease Father   ? Breast cancer Neg Hx   ? ? ? ?Current Outpatient Medications:  ?  acetaminophen (TYLENOL) 500 MG tablet, Take 500-1,000 mg by mouth every 6 (six) hours as needed (pain.)., Disp: , Rfl:  ?  amoxicillin (AMOXIL) 500 MG capsule, Take 500 mg by mouth 3 (three) times daily. Pt states only for dentist appointments., Disp: , Rfl:  ?  diclofenac (VOLTAREN) 50 MG EC tablet, Take 50 mg by mouth 2 (two) times daily., Disp: , Rfl:  ?  hydroxychloroquine (PLAQUENIL) 200 MG tablet, Take 200 mg by mouth at bedtime., Disp: , Rfl:  ?  levothyroxine (SYNTHROID) 100 MCG tablet, Take by mouth., Disp: , Rfl:  ?  omeprazole (PRILOSEC) 20 MG capsule, Take by mouth., Disp: , Rfl:  ?  RINVOQ 15 MG TB24, Take 15 mg by mouth in the morning., Disp: , Rfl:  ?  simvastatin (ZOCOR) 20 MG tablet, Take 1 tablet by mouth at bedtime., Disp: , Rfl:  ?  EUTHYROX 100 MCG tablet, Take 100 mcg by mouth daily before breakfast. (Patient not taking: Reported on 08/23/2021), Disp: , Rfl:  ?  oxyCODONE (ROXICODONE) 5 MG immediate release tablet, Take 1-2 tablets (5-10 mg total) by mouth every 4 (four) hours as needed ((pain) before therapy appointments). (Patient not taking: Reported on 08/23/2021), Disp: 30 tablet, Rfl: 0 ? ? ?Physical exam:  ?Vitals:  ? 08/23/21 1454  ?BP: (!) 158/74  ?Pulse: 69  ?Resp: 18  ?Temp: 98.7 ?F (37.1 ?C)  ?SpO2: 100%  ?Weight: 195 lb 12.8 oz (88.8 kg)  ?Height: '5\' 4"'$  (1.626 m)  ? ?Physical Exam ?Constitutional:   ?   General: She is not in acute  distress. ?Cardiovascular:  ?   Rate and Rhythm: Normal rate and regular rhythm.  ?   Heart sounds: Normal heart sounds.  ?Pulmonary:  ?   Effort: Pulmonary effort is normal.  ?   Breath sounds: Normal breath sounds.  ?Abdominal:  ?   General: Bowel sounds are normal.  ?   Palpations: Abdomen is soft.  ?Skin: ?   General: Skin is warm and dry.  ?Neurological:  ?   Mental Status: She is alert and oriented to person, place, and time.  ?  ? ? ? ? ?  Latest Ref Rng & Units 12/09/2020  ?  8:44 AM  ?CMP  ?Glucose 70 - 99 mg/dL 92    ?BUN 8 - 23 mg/dL 28    ?Creatinine 0.44 - 1.00 mg/dL 0.77    ?Sodium 135 - 145 mmol/L 139    ?Potassium 3.5 - 5.1 mmol/L 3.7    ?Chloride 98 - 111  mmol/L 108    ?CO2 22 - 32 mmol/L 27    ?Calcium 8.9 - 10.3 mg/dL 8.7    ?Total Protein 6.5 - 8.1 g/dL 6.9    ?Total Bilirubin 0.3 - 1.2 mg/dL 1.0    ?Alkaline Phos 38 - 126 U/L 72    ?AST 15 - 41 U/L 21    ?ALT 0 - 44 U/L 24    ? ? ?  Latest Ref Rng & Units 12/09/2020  ?  8:44 AM  ?CBC  ?WBC 4.0 - 10.5 K/uL 5.8    ?Hemoglobin 12.0 - 15.0 g/dL 12.2    ?Hematocrit 36.0 - 46.0 % 36.0    ?Platelets 150 - 400 K/uL 306    ? ? ?Assessment and plan- Patient is a 66 y.o. female with newly diagnosed rectosigmoid adenocarcinoma ? ?As per colonoscopy note as well as my discussion with Dr. Lysbeth Galas the malignant lesion was located about 18 cm from the anal verge which makes it more likely to be a sigmoid rather than a rectal lesion.  I would like to get an MRI pelvis without contrast to characterize this better.  If this turns out to be a high rectal/sigmoid lesion I would recommend upfront surgery for her.  I am also getting a CT chest abdomen and pelvis with contrast to complete her staging work-up.  I will check baseline labs today including CBC with differential CMP ferritin and iron studies and CEA today. ? ?Discussed with the patient that treatment for colon cancer typically involves upfront surgery.  We will know her final stage after surgery is done.   Adjuvant chemotherapy is not recommended for stage I colon cancer.  It is necessary for stage III colon cancer and some stage II colon cancers. ? ?I will meet her for a virtual visit after CT and MRI is done to discuss f

## 2021-08-24 LAB — CEA: CEA: 0.9 ng/mL (ref 0.0–4.7)

## 2021-08-25 ENCOUNTER — Ambulatory Visit
Admission: RE | Admit: 2021-08-25 | Discharge: 2021-08-25 | Disposition: A | Discharge status: Home / Self Care | Payer: BC Managed Care – PPO | Source: Ambulatory Visit | Attending: Oncology | Admitting: Oncology

## 2021-08-25 ENCOUNTER — Telehealth: Payer: Self-pay | Admitting: *Deleted

## 2021-08-25 DIAGNOSIS — C187 Malignant neoplasm of sigmoid colon: Secondary | ICD-10-CM | POA: Insufficient documentation

## 2021-08-25 MED ORDER — IOHEXOL 300 MG/ML  SOLN
100.0000 mL | Freq: Once | INTRAMUSCULAR | Status: AC | PRN
  Administered 2021-08-25: 100 mL via INTRAVENOUS

## 2021-08-25 NOTE — Telephone Encounter (Signed)
I called patient at 1205 today and asked her if she would be able to do the CT chest abdomen and pelvis at 130 today.  She had just taken 2 bites of food when I called her.  She says that there is no way that she could get that done and be at the hospital at 1:30.  I asked her to not eat or drink anything else and I will call her back and see if I can make it for later today and she said that would be okay.  Anderson Malta got on the phone and contacted CT and there was an appointment for 3:00 given to her.  I then called back to the patient and let her know that she has to start drinking the first bottle of the contrast at 1:30, then she will take half a bottle at 2:30, and then take the rest of the bottle with her to the medical mall at [facility].  I also asked her to come about 15 minutes early for the appointment.  The patient said she had probably just come earlier than that just to make sure everything is a okay and she can get there with time to spare.  I also told her that the MRI of the pelvis is set for this Sunday, May 7 at 1 PM and she would again go over to [facility] to the medical mall.  And also for the MRI she does not need any contrast or any kind of instruction she can do what ever she wants that day and come in 20 minutes sooner for the appointment so that she can get registered.  Patient is agreeable with all above ?

## 2021-08-27 ENCOUNTER — Ambulatory Visit
Admission: RE | Admit: 2021-08-27 | Discharge: 2021-08-27 | Disposition: A | Discharge status: Home / Self Care | Payer: BC Managed Care – PPO | Source: Ambulatory Visit | Attending: Oncology | Admitting: Oncology

## 2021-08-27 DIAGNOSIS — C187 Malignant neoplasm of sigmoid colon: Secondary | ICD-10-CM | POA: Diagnosis present

## 2021-08-29 ENCOUNTER — Encounter: Payer: Self-pay | Admitting: Oncology

## 2021-08-29 ENCOUNTER — Inpatient Hospital Stay (HOSPITAL_BASED_OUTPATIENT_CLINIC_OR_DEPARTMENT_OTHER): Payer: BC Managed Care – PPO | Admitting: Oncology

## 2021-08-29 DIAGNOSIS — Z87891 Personal history of nicotine dependence: Secondary | ICD-10-CM | POA: Diagnosis not present

## 2021-08-29 DIAGNOSIS — C187 Malignant neoplasm of sigmoid colon: Secondary | ICD-10-CM

## 2021-08-29 NOTE — Progress Notes (Signed)
I connected with Alejandra Wilson on 08/29/21 at  1:15 PM EDT by video enabled telemedicine visit and verified that I am speaking with the correct person using two identifiers. ?  ?I discussed the limitations, risks, security and privacy concerns of performing an evaluation and management service by telemedicine and the availability of in-person appointments. I also discussed with the patient that there may be a patient responsible charge related to this service. The patient expressed understanding and agreed to proceed. ? ?Other persons participating in the visit and their role in the encounter:  none ? ?Patient's location:  home ?Provider's location:  work ? ?Chief Complaint:  discuss ct scan results and further management ? ?History of present illness: patient is a 66 year old female who never had a colonoscopy but noticed rectal bleeding sometime in November 2022.  This was followed by colonoscopy on 08/21/2021 which showed a 22 mm polypoid lesion in the rectosigmoid colon roughly 18 cm from the anal verge.  This was biopsied and was consistent with moderately differentiated adenocarcinoma.  Patient has been referred for further management.  No family history of colon, breast or pancreatic cancer.   ? ?Interval history no acute complaints at this time.  ? ? ?Review of Systems  ?Constitutional:  Negative for chills, fever, malaise/fatigue and weight loss.  ?HENT:  Negative for congestion, ear discharge and nosebleeds.   ?Eyes:  Negative for blurred vision.  ?Respiratory:  Negative for cough, hemoptysis, sputum production, shortness of breath and wheezing.   ?Cardiovascular:  Negative for chest pain, palpitations, orthopnea and claudication.  ?Gastrointestinal:  Negative for abdominal pain, blood in stool, constipation, diarrhea, heartburn, melena, nausea and vomiting.  ?Genitourinary:  Negative for dysuria, flank pain, frequency, hematuria and urgency.  ?Musculoskeletal:  Negative for back pain, joint pain and  myalgias.  ?Skin:  Negative for rash.  ?Neurological:  Negative for dizziness, tingling, focal weakness, seizures, weakness and headaches.  ?Endo/Heme/Allergies:  Does not bruise/bleed easily.  ?Psychiatric/Behavioral:  Negative for depression and suicidal ideas. The patient does not have insomnia.   ? ?Allergies  ?Allergen Reactions  ? Codeine Nausea Only  ? ? ?Past Medical History:  ?Diagnosis Date  ? GERD (gastroesophageal reflux disease)   ? Hyperlipidemia   ? Hypothyroidism   ? Insomnia   ? Osteoarthritis of right knee   ? Rheumatoid arthritis involving multiple sites with positive rheumatoid factor ([place])   ? ? ?Past Surgical History:  ?Procedure Laterality Date  ? COLONOSCOPY WITH PROPOFOL N/A 08/21/2021  ? Procedure: COLONOSCOPY WITH PROPOFOL;  Surgeon: Lesly Rubenstein, MD;  [facility] END[facility];  Service: Endoscopy;  Laterality: N/A;  ? KNEE CLOSED REDUCTION Right 02/09/2021  ? Procedure: Right knee manipulation with steroid injection;  Surgeon: Corky Mull, MD;  Location: [facility] ORS;  Service: Orthopedics;  Laterality: Right;  ? OVARIAN CYST REMOVAL    ? PARTIAL KNEE ARTHROPLASTY Left 07/09/2019  ? Procedure: UNICOMPARTMENTAL KNEE;  Surgeon: Corky Mull, MD;  Location: [facility] ORS;  Service: Orthopedics;  Laterality: Left;  ? PARTIAL KNEE ARTHROPLASTY Right 12/20/2020  ? Procedure: RIGHT PARTIAL KNEE REPLACEMENT;  Surgeon: Corky Mull, MD;  Location: [facility] ORS;  Service: Orthopedics;  Laterality: Right;  ? ? ?Social History  ? ?Socioeconomic History  ? Marital status: Married  ?  Spouse name: [name]  ? Number of children: 2  ? Years of education: Not on file  ? Highest education level: Not on file  ?Occupational History  ? Not on file  ?Tobacco Use  ?  Smoking status: Former  ?  Types: Cigarettes  ? Smokeless tobacco: Never  ?Vaping Use  ? Vaping Use: Some days  ? Substances: Nicotine  ?Substance and Sexual Activity  ? Alcohol use: No  ? Drug use: Never  ? Sexual activity: Not on file  ?Other Topics  Concern  ? Not on file  ?Social History Narrative  ? Not on file  ? ?Social Determinants of Health  ? ?Financial Resource Strain: Not on file  ?Food Insecurity: Not on file  ?Transportation Needs: Not on file  ?Physical Activity: Not on file  ?Stress: Not on file  ?Social Connections: Not on file  ?Intimate Partner Violence: Not on file  ? ? ?Family History  ?Problem Relation Age of Onset  ? Diabetes Mother   ? Heart disease Father   ? Breast cancer Neg Hx   ? ? ? ?Current Outpatient Medications:  ?  hydroxychloroquine (PLAQUENIL) 200 MG tablet, Take 200 mg by mouth at bedtime., Disp: , Rfl:  ?  levothyroxine (SYNTHROID) 100 MCG tablet, Take by mouth., Disp: , Rfl:  ?  omeprazole (PRILOSEC) 20 MG capsule, Take by mouth., Disp: , Rfl:  ?  RINVOQ 15 MG TB24, Take 15 mg by mouth in the morning., Disp: , Rfl:  ?  simvastatin (ZOCOR) 20 MG tablet, Take 1 tablet by mouth at bedtime., Disp: , Rfl:  ?  acetaminophen (TYLENOL) 500 MG tablet, Take 500-1,000 mg by mouth every 6 (six) hours as needed (pain.). (Patient not taking: Reported on 08/29/2021), Disp: , Rfl:  ?  amoxicillin (AMOXIL) 500 MG capsule, Take 500 mg by mouth 3 (three) times daily. Pt states only for dentist appointments. (Patient not taking: Reported on 08/29/2021), Disp: , Rfl:  ?  diclofenac (VOLTAREN) 50 MG EC tablet, Take 50 mg by mouth 2 (two) times daily. (Patient not taking: Reported on 08/29/2021), Disp: , Rfl:  ?  EUTHYROX 100 MCG tablet, Take 100 mcg by mouth daily before breakfast. (Patient not taking: Reported on 08/23/2021), Disp: , Rfl:  ?  oxyCODONE (ROXICODONE) 5 MG immediate release tablet, Take 1-2 tablets (5-10 mg total) by mouth every 4 (four) hours as needed ((pain) before therapy appointments). (Patient not taking: Reported on 08/23/2021), Disp: 30 tablet, Rfl: 0 ? ?MR Pelvis Wo Contrast ? ?Result Date: 08/27/2021 ?CLINICAL DATA:  Recently diagnosed rectosigmoid carcinoma. EXAM: MRI PELVIS WITHOUT CONTRAST TECHNIQUE: Multiplanar multisequence MR  imaging of the pelvis was performed. No intravenous contrast was administered. Ultrasound gel was administered per rectum to optimize tumor evaluation. COMPARISON:  None Available. FINDINGS: TUMOR LOCATION A focal, sessile mural density is seen near the rectosigmoid junction, which likely represents the mass seen on recent colonoscopy. No other masses are visualized in the rectum on this study. Assuming this is the site of the primary carcinoma, the following features are noted: Tumor distance from Anal Verge/Skin Surface:  17.9 cm Tumor distance to Internal Anal Sphincter: 15.6 cm TUMOR DESCRIPTION Circumferential Extent: 20% along the posterior wall from the 4-8 o'clock positions. Tumor Length: 1.7 cm T - CATEGORY Extension through Muscularis Propria: No= T1/T2 Shortest Distance of any tumor/node from Mesorectal Fascia: 2 mm Extramural Vascular Invasion/Tumor Thrombus: No Invasion of Anterior Peritoneal Reflection: No Involvement of Adjacent Organs or Pelvic Sidewall: No Levator Ani Involvement: No N - CATEGORY Mesorectal Lymph Nodes >=83m: None=N0 Extra-mesorectal Lymphadenopathy: No Other:  None. IMPRESSION: Focal sessile mural density near the rectosigmoid junction. Assuming this is the site of the primary carcinoma, local stage by imaging is: T1/2,  N0. Electronically Signed   By: Marlaine Hind M.D.   On: 08/27/2021 14:50  ? ?CT CHEST ABDOMEN PELVIS W CONTRAST ? ?Result Date: 08/25/2021 ?CLINICAL DATA:  Colon cancer.  Malignant neoplasm of sigmoid colon. EXAM: CT CHEST, ABDOMEN, AND PELVIS WITH CONTRAST TECHNIQUE: Multidetector CT imaging of the chest, abdomen and pelvis was performed following the standard protocol during bolus administration of intravenous contrast. RADIATION DOSE REDUCTION: This exam was performed according to the departmental dose-optimization program which includes automated exposure control, adjustment of the mA and/or kV according to patient size and/or use of iterative reconstruction  technique. CONTRAST:  168m OMNIPAQUE IOHEXOL 300 MG/ML  SOLN COMPARISON:  None Available. FINDINGS: CT CHEST FINDINGS Cardiovascular: Heart size is normal. Coronary artery calcification is present. Mild aortic ather

## 2021-08-30 ENCOUNTER — Ambulatory Visit: Payer: Self-pay | Admitting: General Surgery

## 2021-08-30 NOTE — H&P (View-Only) (Signed)
?PATIENT PROFILE: ?Alejandra Wilson is a 66 y.o. female who presents to the Clinic for consultation at the request of Dr. Haig Prophet for evaluation of malignancy of the sigmoid colon. ? ?PCP:  Alejandra Nightingale, PA ? ?HISTORY OF PRESENT ILLNESS: ?Alejandra Wilson reports she had a colonoscopy which showed a small mass at the rectosigmoid junction.  I personally discussed the colonoscopy with the gastroenterologist.  As per gastroenterologist the mass was located at 8 cm from the anal verge.  Biopsy showed invasive adenocarcinoma. ? ?Since he was very close to the rectal area, staging preoperative MRI was done showing mass at the distal sigmoid colon.  CT scan of the chest abdomen and pelvis shows no concerning distant metastatic disease.  I personally evaluated the images of the MRI, CT scan of the chest abdomen and pelvis. ? ?Patient was evaluated by medical oncology and recommendation was to proceed with surgery upfront. ? ? ?PROBLEM LIST: ?Problem List  Date Reviewed: 06/23/2021  ? ?       Noted  ? Arthrofibrosis of knee joint, right 02/10/2021  ? Status post right partial knee replacement 12/21/2020  ? Primary osteoarthritis of right knee 09/12/2020  ? Status post left partial knee replacement 07/09/2019  ? Primary osteoarthritis of left knee 06/08/2019  ? Rheumatoid arthritis involving multiple sites with positive rheumatoid factor (CMS-HCC) Unknown  ? Overview  ?  Methotrexate.  Nodules.  Plaquenil.  Declines TNF drugs ?  ?  ? 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 Rheumatoid arthritis, seropositive (CMS-HCC) 04/04/2015  ? Hyperlipidemia Unknown  ? Hypothyroidism Unknown  ? Allergic rhinitis Unknown  ? ? ?GENERAL REVIEW OF SYSTEMS:  ? ?General ROS: negative for - chills, fatigue, fever, weight gain or weight loss ?Allergy and Immunology ROS: negative for - hives  ?Hematological and Lymphatic ROS: negative for - bleeding problems or bruising, negative for palpable nodes ?Endocrine ROS: negative for - heat or cold  intolerance, hair changes ?Respiratory ROS: negative for - cough, shortness of breath or wheezing ?Cardiovascular ROS: no chest pain or palpitations ?GI ROS: negative for nausea, vomiting, abdominal pain, diarrhea, constipation ?Musculoskeletal ROS: negative for - joint swelling or muscle pain ?Neurological ROS: negative for - confusion, syncope ?Dermatological ROS: negative for pruritus and rash ?Psychiatric: negative for anxiety, depression, difficulty sleeping and memory loss ? ?MEDICATIONS: ?Current Outpatient Medications  ?Medication Sig Dispense Refill  ? acetaminophen (TYLENOL) 500 MG tablet Take 500 mg by mouth as needed for Pain    ? amoxicillin (AMOXIL) 500 MG capsule Take by mouth Prior to Dental procedures    ? diclofenac (VOLTAREN) 50 MG EC tablet Take 1 tablet (50 mg total) by mouth 2 (two) times daily with meals 180 tablet 1  ? hydrOXYchloroQUINE (PLAQUENIL) 200 mg tablet Take 1 tablet (200 mg total) by mouth once daily 90 tablet 1  ? levothyroxine (SYNTHROID) 100 MCG tablet Take 1 tablet (100 mcg total) by mouth once daily Take on an empty stomach with a glass of water at least 30-60 minutes before breakfast. 90 tablet 3  ? omeprazole (PRILOSEC) 20 MG DR capsule Take 1 capsule (20 mg total) by mouth once daily 90 capsule 3  ? simvastatin (ZOCOR) 20 MG tablet Take 1 tablet (20 mg total) by mouth at bedtime 90 tablet 3  ? upadacitinib (RINVOQ) 15 mg Tb24 Take 1 tablet by mouth once daily Once a day 90 tablet 1  ? amoxicillin (AMOXIL) 500 MG capsule  (Patient not taking: Reported on 07/19/2021)    ? ciprofloxacin  HCl (CIPRO) 500 MG tablet Take 1 tablet (500 mg total) by mouth 2 (two) times daily for 1 day Take at morning and evening the day before surgery 2 tablet 0  ? metroNIDAZOLE (FLAGYL) 500 MG tablet Take 2 tablets at 2 pm, 3 pm and 10 pm the day before surgery. 6 tablet 0  ? sodium, potassium, and magnesium (SUPREP) oral solution Take 1 Bottle by mouth as directed One kit contains 2 bottles.  Take  both bottles at the times instructed by your provider. (Patient not taking: Reported on 08/30/2021) 354 mL 0  ? ?No current facility-administered medications for this visit.  ? ? ?ALLERGIES: ?Codeine ? ?PAST MEDICAL HISTORY: ?Past Medical History:  ?Diagnosis Date  ? Allergic rhinitis   ? Hyperlipidemia   ? Hypertension   ? Hypothyroidism   ? Rheumatoid arthritis involving multiple sites with positive rheumatoid factor (CMS-HCC)   ? Methotrexate.  Nodules.  Plaquenil.  Declines TNF drugs  ? Tobacco abuse   ? ? ?PAST SURGICAL HISTORY: ?Past Surgical History:  ?Procedure Laterality Date  ? Left unicondylar knee arthroplasty. Left 07/09/2019  ? Dr.poggi   ? Right unicondylar knee arthroplasty. Right 12/20/2020  ? Dr. Poggi  ? Manipulation under anesthesia with steroid injection, right knee Right 02/09/2021  ? Dr.Poggi  ? Cyst removed from ovary    ?  ? ?FAMILY HISTORY: ?Family History  ?Problem Relation Age of Onset  ? Myocardial Infarction (Heart attack) Father   ? Diabetes Mother   ? Heart failure Mother   ? Coronary Artery Disease (Blocked arteries around heart) Mother   ? Heart disease Brother   ? Diabetes Brother   ? High blood pressure (Hypertension) Brother   ? Diabetes Brother   ? Heart disease Brother   ? High blood pressure (Hypertension) Brother   ?  ? ?SOCIAL HISTORY: ?Social History  ? ?Socioeconomic History  ? Marital status: Married  ?Tobacco Use  ? Smoking status: Former  ?  Types: Cigarettes  ?  Quit date: 08/22/2014  ?  Years since quitting: 7.0  ? Smokeless tobacco: Never  ? Tobacco comments:  ?  Pt uses Vape occassionally  ?Vaping Use  ? Vaping Use: Never used  ?Substance and Sexual Activity  ? Alcohol use: No  ?  Alcohol/week: 0.0 standard drinks  ? Drug use: No  ? Sexual activity: Defer  ? ? ?PHYSICAL EXAM: ?Vitals:  ? 08/30/21 1020  ?BP: (!) 196/99  ?Pulse: 73  ? ?Body mass index is 34.54 kg/m?. ?Weight: 88.5 kg (195 lb)  ? ?GENERAL: Alert, active, oriented x3 ? ?HEENT: Pupils equal reactive to  light. Extraocular movements are intact. Sclera clear. Palpebral conjunctiva normal red color.Pharynx clear. ? ?NECK: Supple with no palpable mass and no adenopathy. ? ?LUNGS: Sound clear with no rales rhonchi or wheezes. ? ?HEART: Regular rhythm S1 and S2 without murmur. ? ?ABDOMEN: Soft and depressible, nontender with no palpable mass, no hepatomegaly. Wounds dry and clean. ? ?EXTREMITIES: Well-developed well-nourished symmetrical with no dependent edema. ? ?NEUROLOGICAL: Awake alert oriented, facial expression symmetrical, moving all extremities. ? ?REVIEW OF DATA: ?I have reviewed the following data today: ?No visits with results within 3 Month(s) from this visit.  ?Latest known visit with results is:  ?Office Visit on 03/22/2021  ?Component Date Value  ? Diagnostic Interpretation 03/22/2021 Comment   ? Specimen adequacy: - Lab* 03/22/2021 Comment   ? Clinician provided ICD10* 03/22/2021 Comment   ? PERFORMED BY: - [facility] 03/22/2021 Comment   ?   QC reviewed by: - [facility] 03/22/2021 Comment   ? . - [facility] 03/22/2021 .   ? Note: - [facility] 03/22/2021 Comment   ? Test Method MT21 - [facility] 03/22/2021 Comment   ? . - [facility] 03/22/2021 Comment   ?  ? ?ASSESSMENT: ?Ms. Duplechain is a 66 y.o. female presenting for consultation for malignant neoplasm of the sigmoid colon. ? ?Patient was again reoriented about diagnosis of the malignant neoplasm of the sigmoid colon.  She has been already evaluated by medical oncology.  She understand about the recommendation and purpose of surgical management. ? ? ? ?I discussed with patient the procedure of partial colectomy with anastomosis.  I discussed the plan to start with a minimally invasive approach.  I discussed the patient the risk of surgery that includes but not limited to bleeding, infection, leak of the anastomosis, injury to bladder or ureter, kidneys, small bowel, intestinal obstruction, among others.  Patient also oriented about the risk of general anesthesia.  The  patient reports that she understand the risk and agreed to proceed with surgery. ? ?Preop CEA within normal limits ? ?Malignant neoplasm of sigmoid colon (CMS-HCC) [C18.7] ? ?PLAN: ?Robotic assisted laparoscopic partial

## 2021-08-30 NOTE — H&P (Signed)
?PATIENT PROFILE: ?Alejandra Wilson is a 66 y.o. female who presents to the Clinic for consultation at the request of Dr. Haig Prophet for evaluation of malignancy of the sigmoid colon. ? ?PCP:  Sheron Nightingale, PA ? ?HISTORY OF PRESENT ILLNESS: ?[name] reports she had a colonoscopy which showed a small mass at the rectosigmoid junction.  I personally discussed the colonoscopy with the gastroenterologist.  As per gastroenterologist the mass was located at 8 cm from the anal verge.  Biopsy showed invasive adenocarcinoma. ? ?Since he was very close to the rectal area, staging preoperative MRI was done showing mass at the distal sigmoid colon.  CT scan of the chest abdomen and pelvis shows no concerning distant metastatic disease.  I personally evaluated the images of the MRI, CT scan of the chest abdomen and pelvis. ? ?Patient was evaluated by medical oncology and recommendation was to proceed with surgery upfront. ? ? ?PROBLEM LIST: ?Problem List  Date Reviewed: 06/23/2021  ? ?       Noted  ? Arthrofibrosis of knee joint, right 02/10/2021  ? Status post right partial knee replacement 12/21/2020  ? Primary osteoarthritis of right knee 09/12/2020  ? Status post left partial knee replacement 07/09/2019  ? Primary osteoarthritis of left knee 06/08/2019  ? Rheumatoid arthritis involving multiple sites with positive rheumatoid factor (CMS-HCC) Unknown  ? Overview  ?  Methotrexate.  Nodules.  Plaquenil.  Declines TNF drugs ?  ?  ? 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 Rheumatoid arthritis, seropositive (CMS-HCC) 04/04/2015  ? Hyperlipidemia Unknown  ? Hypothyroidism Unknown  ? Allergic rhinitis Unknown  ? ? ?GENERAL REVIEW OF SYSTEMS:  ? ?General ROS: negative for - chills, fatigue, fever, weight gain or weight loss ?Allergy and Immunology ROS: negative for - hives  ?Hematological and Lymphatic ROS: negative for - bleeding problems or bruising, negative for palpable nodes ?Endocrine ROS: negative for - heat or cold  intolerance, hair changes ?Respiratory ROS: negative for - cough, shortness of breath or wheezing ?Cardiovascular ROS: no chest pain or palpitations ?GI ROS: negative for nausea, vomiting, abdominal pain, diarrhea, constipation ?Musculoskeletal ROS: negative for - joint swelling or muscle pain ?Neurological ROS: negative for - confusion, syncope ?Dermatological ROS: negative for pruritus and rash ?Psychiatric: negative for anxiety, depression, difficulty sleeping and memory loss ? ?MEDICATIONS: ?Current Outpatient Medications  ?Medication Sig Dispense Refill  ? acetaminophen (TYLENOL) 500 MG tablet Take 500 mg by mouth as needed for Pain    ? amoxicillin (AMOXIL) 500 MG capsule Take by mouth Prior to Dental procedures    ? diclofenac (VOLTAREN) 50 MG EC tablet Take 1 tablet (50 mg total) by mouth 2 (two) times daily with meals 180 tablet 1  ? hydrOXYchloroQUINE (PLAQUENIL) 200 mg tablet Take 1 tablet (200 mg total) by mouth once daily 90 tablet 1  ? levothyroxine (SYNTHROID) 100 MCG tablet Take 1 tablet (100 mcg total) by mouth once daily Take on an empty stomach with a glass of water at least 30-60 minutes before breakfast. 90 tablet 3  ? omeprazole (PRILOSEC) 20 MG DR capsule Take 1 capsule (20 mg total) by mouth once daily 90 capsule 3  ? simvastatin (ZOCOR) 20 MG tablet Take 1 tablet (20 mg total) by mouth at bedtime 90 tablet 3  ? upadacitinib (RINVOQ) 15 mg Tb24 Take 1 tablet by mouth once daily Once a day 90 tablet 1  ? amoxicillin (AMOXIL) 500 MG capsule  (Patient not taking: Reported on 07/19/2021)    ? ciprofloxacin  HCl (CIPRO) 500 MG tablet Take 1 tablet (500 mg total) by mouth 2 (two) times daily for 1 day Take at morning and evening the day before surgery 2 tablet 0  ? metroNIDAZOLE (FLAGYL) 500 MG tablet Take 2 tablets at 2 pm, 3 pm and 10 pm the day before surgery. 6 tablet 0  ? sodium, potassium, and magnesium (SUPREP) oral solution Take 1 Bottle by mouth as directed One kit contains 2 bottles.  Take  both bottles at the times instructed by your provider. (Patient not taking: Reported on 08/30/2021) 354 mL 0  ? ?No current facility-administered medications for this visit.  ? ? ?ALLERGIES: ?Codeine ? ?PAST MEDICAL HISTORY: ?Past Medical History:  ?Diagnosis Date  ? Allergic rhinitis   ? Hyperlipidemia   ? Hypertension   ? Hypothyroidism   ? Rheumatoid arthritis involving multiple sites with positive rheumatoid factor (CMS-HCC)   ? Methotrexate.  Nodules.  Plaquenil.  Declines TNF drugs  ? Tobacco abuse   ? ? ?PAST SURGICAL HISTORY: ?Past Surgical History:  ?Procedure Laterality Date  ? Left unicondylar knee arthroplasty. Left 07/09/2019  ? Dr.poggi   ? Right unicondylar knee arthroplasty. Right 12/20/2020  ? Dr. Roland Rack  ? Manipulation under anesthesia with steroid injection, right knee Right 02/09/2021  ? Dr.Poggi  ? Cyst removed from ovary    ?  ? ?FAMILY HISTORY: ?Family History  ?Problem Relation Age of Onset  ? Myocardial Infarction (Heart attack) Father   ? Diabetes Mother   ? Heart failure Mother   ? Coronary Artery Disease (Blocked arteries around heart) Mother   ? Heart disease Brother   ? Diabetes Brother   ? High blood pressure (Hypertension) Brother   ? Diabetes Brother   ? Heart disease Brother   ? High blood pressure (Hypertension) Brother   ?  ? ?SOCIAL HISTORY: ?Social History  ? ?Socioeconomic History  ? Marital status: Married  ?Tobacco Use  ? Smoking status: Former  ?  Types: Cigarettes  ?  Quit date: 08/22/2014  ?  Years since quitting: 7.0  ? Smokeless tobacco: Never  ? Tobacco comments:  ?  Pt uses Vape occassionally  ?Vaping Use  ? Vaping Use: Never used  ?Substance and Sexual Activity  ? Alcohol use: No  ?  Alcohol/week: 0.0 standard drinks  ? Drug use: No  ? Sexual activity: Defer  ? ? ?PHYSICAL EXAM: ?Vitals:  ? 08/30/21 1020  ?BP: (!) 196/99  ?Pulse: 73  ? ?Body mass index is 34.54 kg/m?. ?Weight: 88.5 kg (195 lb)  ? ?GENERAL: Alert, active, oriented x3 ? ?HEENT: Pupils equal reactive to  light. Extraocular movements are intact. Sclera clear. Palpebral conjunctiva normal red color.Pharynx clear. ? ?NECK: Supple with no palpable mass and no adenopathy. ? ?LUNGS: Sound clear with no rales rhonchi or wheezes. ? ?HEART: Regular rhythm S1 and S2 without murmur. ? ?ABDOMEN: Soft and depressible, nontender with no palpable mass, no hepatomegaly. Wounds dry and clean. ? ?EXTREMITIES: Well-developed well-nourished symmetrical with no dependent edema. ? ?NEUROLOGICAL: Awake alert oriented, facial expression symmetrical, moving all extremities. ? ?REVIEW OF DATA: ?I have reviewed the following data today: ?No visits with results within 3 Month(s) from this visit.  ?Latest known visit with results is:  ?Office Visit on 03/22/2021  ?Component Date Value  ? Diagnostic Interpretation 03/22/2021 Comment   ? Specimen adequacy: - Lab* 03/22/2021 Comment   ? Clinician provided ICD10* 03/22/2021 Comment   ? PERFORMED BY: - [facility] 03/22/2021 Comment   ?  QC reviewed by: - [facility] 03/22/2021 Comment   ? . - [facility] 03/22/2021 .   ? Note: - [facility] 03/22/2021 Comment   ? Test Method MT21 - [facility] 03/22/2021 Comment   ? . - [facility] 03/22/2021 Comment   ?  ? ?ASSESSMENT: ?Ms. Duplechain is a 66 y.o. female presenting for consultation for malignant neoplasm of the sigmoid colon. ? ?Patient was again reoriented about diagnosis of the malignant neoplasm of the sigmoid colon.  She has been already evaluated by medical oncology.  She understand about the recommendation and purpose of surgical management. ? ? ? ?I discussed with patient the procedure of partial colectomy with anastomosis.  I discussed the plan to start with a minimally invasive approach.  I discussed the patient the risk of surgery that includes but not limited to bleeding, infection, leak of the anastomosis, injury to bladder or ureter, kidneys, small bowel, intestinal obstruction, among others.  Patient also oriented about the risk of general anesthesia.  The  patient reports that she understand the risk and agreed to proceed with surgery. ? ?Preop CEA within normal limits ? ?Malignant neoplasm of sigmoid colon (CMS-HCC) [C18.7] ? ?PLAN: ?Robotic assisted laparoscopic partial

## 2021-09-04 ENCOUNTER — Other Ambulatory Visit: Payer: Self-pay

## 2021-09-04 ENCOUNTER — Encounter
Admission: RE | Admit: 2021-09-04 | Discharge: 2021-09-04 | Disposition: A | Discharge status: Home / Self Care | Payer: BC Managed Care – PPO | Source: Ambulatory Visit | Attending: General Surgery | Admitting: General Surgery

## 2021-09-04 DIAGNOSIS — E038 Other specified hypothyroidism: Secondary | ICD-10-CM

## 2021-09-04 NOTE — Patient Instructions (Addendum)
Your procedure is scheduled on: 09/06/2021 ?Report to the Registration Desk on the 1st floor of the [facility]. ?To find out your arrival time, please call [phone] between 1PM - 3PM on: 09/05/2021 ?If your arrival time is 6:00 am, do not arrive prior to that time as the [facility] entrance doors do not open until 6:00 am. ? ?REMEMBER: ?Instructions that are not followed completely may result in serious medical risk, up to and including death; or upon the discretion of your surgeon and anesthesiologist your surgery may need to be rescheduled. ? ?Do not eat food after midnight the night before surgery.  ?No gum chewing, lozengers or hard candies. ? ? ?Continue taking your  medications (unless you are instructed to hold) until the day prior to surgery but  ?TAKE THESE MEDICATIONS  only THE MORNING OF SURGERY WITH A SIP OF WATER: ?levothyroxine (SYNTHROID) ?omeprazole (PRILOSEC) ? ? ?One week prior to surgery: ?Stop Anti-inflammatories (NSAIDS) such as Advil, Aleve, Ibuprofen, Motrin, Naproxen, Naprosyn and Aspirin based products such as Excedrin, Goodys Powder, BC Powder and  diclofenac (VOLTAREN) ?Stop ANY OVER THE COUNTER supplements until after surgery. ?You may however, continue to take Tylenol if needed for pain up until the day of surgery. ? ?No Alcohol for 24 hours before or after surgery. ? ?No Smoking including e-cigarettes for 24 hours prior to surgery.  ?No chewable tobacco products for at least 6 hours prior to surgery.  ?No nicotine patches on the day of surgery. ? ?Do not use any "recreational" drugs for at least a week prior to your surgery.  ?Please be advised that the combination of cocaine and anesthesia may have negative outcomes, up to and including death. ?If you test positive for cocaine, your surgery will be cancelled. ? ?On the morning of surgery brush your teeth with toothpaste and water, you may rinse your mouth with mouthwash if you wish. ?Do not swallow any toothpaste or  mouthwash. ? ?Use CHG Soap as directed on instruction sheet-  provided for you  ? ?Do not wear jewelry, make-up, hairpins, clips or nail polish. ? ?Do not wear lotions, powders, or perfumes.  ? ?Do not shave body from the neck down 48 hours prior to surgery just in case you cut yourself which could leave a site for infection.  ?Also, freshly shaved skin may become irritated if using the CHG soap. ? ?Contact lenses, hearing aids and dentures may not be worn into surgery. ? ?Do not bring valuables to the hospital. [facility] is not responsible for any missing/lost belongings or valuables.  ? ?Please follow his instructions about bowel prep.  ? ? ? ?Notify your doctor if there is any change in your medical condition (cold, fever, infection). ? ?Wear comfortable clothing (specific to your surgery type) to the hospital. ? ?After surgery, you can help prevent lung complications by doing breathing exercises.  ?Take deep breaths and cough every 1-2 hours. Your doctor may order a device called an Incentive Spirometer to help you take deep breaths. ? ?When coughing or sneezing, hold a pillow firmly against your incision with both hands. This is called ?splinting.? Doing this helps protect your incision. It also decreases belly discomfort. ? ?If you are being admitted to the hospital overnight, leave your suitcase in the car. ?After surgery it may be brought to your room. ? ?If you are being discharged the day of surgery, you will not be allowed to drive home. ?You will need a responsible adult (18 years or  older) to drive you home and stay with you that night.  ? ?If you are taking public transportation, you will need to have a responsible adult (18 years or older) with you. ?Please confirm with your physician that it is acceptable to use public transportation.  ? ?Please call the [facility] at [phone] if you have any questions about these instructions. ? ?Surgery Visitation Policy: ? ?Patients  undergoing a surgery or procedure may have two family members or support persons with them as long as the person is not COVID-19 positive or experiencing its symptoms.  ? ?Inpatient Visitation:   ? ?Visiting hours are 7 a.m. to 8 p.m. ?Up to four visitors are allowed at one time in a patient room, including children. The visitors may rotate out with other people during the day. One designated support person (adult) may remain overnight.  ?

## 2021-09-06 ENCOUNTER — Other Ambulatory Visit: Payer: Self-pay

## 2021-09-06 ENCOUNTER — Inpatient Hospital Stay: Payer: BC Managed Care – PPO | Admitting: Anesthesiology

## 2021-09-06 ENCOUNTER — Inpatient Hospital Stay
Admission: RE | Admit: 2021-09-06 | Discharge: 2021-09-08 | DRG: 331 | Disposition: A | Discharge status: Home / Self Care | Payer: BC Managed Care – PPO | Attending: General Surgery | Admitting: General Surgery

## 2021-09-06 ENCOUNTER — Encounter
Admission: RE | Disposition: A | Discharge status: Home / Self Care | Payer: Self-pay | Source: Home / Self Care | Attending: General Surgery

## 2021-09-06 ENCOUNTER — Encounter: Payer: Self-pay | Admitting: General Surgery

## 2021-09-06 DIAGNOSIS — E039 Hypothyroidism, unspecified: Secondary | ICD-10-CM | POA: Diagnosis present

## 2021-09-06 DIAGNOSIS — K219 Gastro-esophageal reflux disease without esophagitis: Secondary | ICD-10-CM | POA: Diagnosis present

## 2021-09-06 DIAGNOSIS — Z6834 Body mass index (BMI) 34.0-34.9, adult: Secondary | ICD-10-CM

## 2021-09-06 DIAGNOSIS — C187 Malignant neoplasm of sigmoid colon: Secondary | ICD-10-CM | POA: Diagnosis present

## 2021-09-06 DIAGNOSIS — Z96651 Presence of right artificial knee joint: Secondary | ICD-10-CM | POA: Diagnosis present

## 2021-09-06 DIAGNOSIS — M170 Bilateral primary osteoarthritis of knee: Secondary | ICD-10-CM | POA: Diagnosis present

## 2021-09-06 DIAGNOSIS — E038 Other specified hypothyroidism: Secondary | ICD-10-CM

## 2021-09-06 DIAGNOSIS — M0579 Rheumatoid arthritis with rheumatoid factor of multiple sites without organ or systems involvement: Secondary | ICD-10-CM | POA: Diagnosis present

## 2021-09-06 DIAGNOSIS — Z87891 Personal history of nicotine dependence: Secondary | ICD-10-CM

## 2021-09-06 DIAGNOSIS — E785 Hyperlipidemia, unspecified: Secondary | ICD-10-CM | POA: Diagnosis present

## 2021-09-06 DIAGNOSIS — Z833 Family history of diabetes mellitus: Secondary | ICD-10-CM

## 2021-09-06 DIAGNOSIS — Z8249 Family history of ischemic heart disease and other diseases of the circulatory system: Secondary | ICD-10-CM | POA: Diagnosis not present

## 2021-09-06 DIAGNOSIS — I10 Essential (primary) hypertension: Secondary | ICD-10-CM | POA: Diagnosis present

## 2021-09-06 DIAGNOSIS — E669 Obesity, unspecified: Secondary | ICD-10-CM | POA: Diagnosis present

## 2021-09-06 DIAGNOSIS — C189 Malignant neoplasm of colon, unspecified: Principal | ICD-10-CM | POA: Diagnosis present

## 2021-09-06 DIAGNOSIS — Z79899 Other long term (current) drug therapy: Secondary | ICD-10-CM | POA: Diagnosis not present

## 2021-09-06 DIAGNOSIS — Z79890 Hormone replacement therapy (postmenopausal): Secondary | ICD-10-CM | POA: Diagnosis not present

## 2021-09-06 LAB — TYPE AND SCREEN
ABO/RH(D): A POS
Antibody Screen: NEGATIVE

## 2021-09-06 SURGERY — COLECTOMY, PARTIAL, ROBOT-ASSISTED, LAPAROSCOPIC
Anesthesia: General | Site: Abdomen

## 2021-09-06 MED ORDER — APREPITANT 40 MG PO CAPS
ORAL_CAPSULE | ORAL | Status: AC
  Filled 2021-09-06: qty 1

## 2021-09-06 MED ORDER — SODIUM CHLORIDE 0.9 % IV SOLN
2.0000 g | INTRAVENOUS | Status: AC
  Administered 2021-09-06: 2 g via INTRAVENOUS

## 2021-09-06 MED ORDER — ROCURONIUM BROMIDE 10 MG/ML (PF) SYRINGE
PREFILLED_SYRINGE | INTRAVENOUS | Status: AC
  Filled 2021-09-06: qty 10

## 2021-09-06 MED ORDER — SODIUM CHLORIDE 0.9 % IV SOLN: INTRAVENOUS | Status: DC

## 2021-09-06 MED ORDER — KETAMINE HCL 10 MG/ML IJ SOLN
INTRAMUSCULAR | Status: DC | PRN
  Administered 2021-09-06: 20 mg via INTRAVENOUS
  Administered 2021-09-06: 10 mg via INTRAVENOUS

## 2021-09-06 MED ORDER — "VISTASEAL 4 ML SINGLE DOSE KIT "
PACK | CUTANEOUS | Status: DC | PRN
  Administered 2021-09-06: 4 mL via TOPICAL

## 2021-09-06 MED ORDER — ACETAMINOPHEN 10 MG/ML IV SOLN
INTRAVENOUS | Status: DC | PRN
  Administered 2021-09-06: 1000 mg via INTRAVENOUS

## 2021-09-06 MED ORDER — KETAMINE HCL 50 MG/5ML IJ SOSY
PREFILLED_SYRINGE | INTRAMUSCULAR | Status: AC
  Filled 2021-09-06: qty 5

## 2021-09-06 MED ORDER — ORAL CARE MOUTH RINSE: 15.0000 mL | Freq: Once | OROMUCOSAL | Status: AC

## 2021-09-06 MED ORDER — BUPIVACAINE LIPOSOME 1.3 % IJ SUSP
INTRAMUSCULAR | Status: DC | PRN
  Administered 2021-09-06: 20 mL

## 2021-09-06 MED ORDER — MORPHINE SULFATE (PF) 4 MG/ML IV SOLN: 4.0000 mg | INTRAVENOUS | Status: DC | PRN

## 2021-09-06 MED ORDER — PROPOFOL 500 MG/50ML IV EMUL
INTRAVENOUS | Status: AC
  Filled 2021-09-06: qty 50

## 2021-09-06 MED ORDER — HYDROMORPHONE HCL 1 MG/ML IJ SOLN
INTRAMUSCULAR | Status: AC
  Administered 2021-09-06: 0.25 mg via INTRAVENOUS
  Filled 2021-09-06: qty 1

## 2021-09-06 MED ORDER — MIDAZOLAM HCL 2 MG/2ML IJ SOLN
INTRAMUSCULAR | Status: DC | PRN
  Administered 2021-09-06: 2 mg via INTRAVENOUS

## 2021-09-06 MED ORDER — ENOXAPARIN SODIUM 40 MG/0.4ML IJ SOSY
40.0000 mg | PREFILLED_SYRINGE | INTRAMUSCULAR | Status: DC
  Administered 2021-09-07: 40 mg via SUBCUTANEOUS
  Filled 2021-09-06: qty 0.4

## 2021-09-06 MED ORDER — PROPOFOL 10 MG/ML IV BOLUS
INTRAVENOUS | Status: AC
  Filled 2021-09-06: qty 20

## 2021-09-06 MED ORDER — HYDROMORPHONE HCL 1 MG/ML IJ SOLN
0.2500 mg | INTRAMUSCULAR | Status: DC | PRN
  Administered 2021-09-06: 0.5 mg via INTRAVENOUS
  Administered 2021-09-06: 0.25 mg via INTRAVENOUS

## 2021-09-06 MED ORDER — FENTANYL CITRATE (PF) 100 MCG/2ML IJ SOLN
INTRAMUSCULAR | Status: DC | PRN
Start: 2021-09-06 — End: 2021-09-06
  Administered 2021-09-06 (×2): 50 ug via INTRAVENOUS

## 2021-09-06 MED ORDER — DEXAMETHASONE SODIUM PHOSPHATE 10 MG/ML IJ SOLN
INTRAMUSCULAR | Status: AC
  Filled 2021-09-06: qty 1

## 2021-09-06 MED ORDER — PROMETHAZINE HCL 25 MG/ML IJ SOLN: 6.2500 mg | INTRAMUSCULAR | Status: DC | PRN

## 2021-09-06 MED ORDER — HEPARIN SODIUM (PORCINE) 5000 UNIT/ML IJ SOLN
5000.0000 [IU] | Freq: Once | INTRAMUSCULAR | Status: AC
  Administered 2021-09-06: 5000 [IU] via SUBCUTANEOUS

## 2021-09-06 MED ORDER — BUPIVACAINE LIPOSOME 1.3 % IJ SUSP
INTRAMUSCULAR | Status: AC
  Filled 2021-09-06: qty 20

## 2021-09-06 MED ORDER — ONDANSETRON HCL 4 MG/2ML IJ SOLN
INTRAMUSCULAR | Status: DC | PRN
  Administered 2021-09-06: 4 mg via INTRAVENOUS

## 2021-09-06 MED ORDER — MIDAZOLAM HCL 2 MG/2ML IJ SOLN
INTRAMUSCULAR | Status: AC
  Filled 2021-09-06: qty 2

## 2021-09-06 MED ORDER — BUPIVACAINE HCL (PF) 0.5 % IJ SOLN
INTRAMUSCULAR | Status: AC
  Filled 2021-09-06: qty 30

## 2021-09-06 MED ORDER — SUGAMMADEX SODIUM 200 MG/2ML IV SOLN
INTRAVENOUS | Status: DC | PRN
  Administered 2021-09-06: 200 mg via INTRAVENOUS

## 2021-09-06 MED ORDER — SEVOFLURANE IN SOLN
RESPIRATORY_TRACT | Status: AC
  Filled 2021-09-06: qty 250

## 2021-09-06 MED ORDER — CELECOXIB 200 MG PO CAPS
200.0000 mg | ORAL_CAPSULE | Freq: Two times a day (BID) | ORAL | Status: DC
  Administered 2021-09-06 – 2021-09-08 (×4): 200 mg via ORAL
  Filled 2021-09-06 (×5): qty 1

## 2021-09-06 MED ORDER — APREPITANT 40 MG PO CAPS
40.0000 mg | ORAL_CAPSULE | Freq: Once | ORAL | Status: AC
  Administered 2021-09-06: 40 mg via ORAL

## 2021-09-06 MED ORDER — INDOCYANINE GREEN 25 MG IV SOLR
INTRAVENOUS | Status: DC | PRN
  Administered 2021-09-06 (×2): 5 mg via INTRAVENOUS

## 2021-09-06 MED ORDER — LEVOTHYROXINE SODIUM 100 MCG PO TABS
100.0000 ug | ORAL_TABLET | Freq: Every day | ORAL | Status: DC
  Administered 2021-09-07 – 2021-09-08 (×2): 100 ug via ORAL
  Filled 2021-09-06 (×2): qty 1

## 2021-09-06 MED ORDER — LIDOCAINE HCL (PF) 2 % IJ SOLN
INTRAMUSCULAR | Status: AC
  Filled 2021-09-06: qty 5

## 2021-09-06 MED ORDER — EPHEDRINE 5 MG/ML INJ
INTRAVENOUS | Status: AC
  Filled 2021-09-06: qty 5

## 2021-09-06 MED ORDER — GABAPENTIN 300 MG PO CAPS
300.0000 mg | ORAL_CAPSULE | Freq: Two times a day (BID) | ORAL | Status: DC
  Administered 2021-09-06 – 2021-09-08 (×4): 300 mg via ORAL
  Filled 2021-09-06 (×5): qty 1

## 2021-09-06 MED ORDER — FENTANYL CITRATE (PF) 100 MCG/2ML IJ SOLN
INTRAMUSCULAR | Status: AC
  Filled 2021-09-06: qty 2

## 2021-09-06 MED ORDER — HEPARIN SODIUM (PORCINE) 5000 UNIT/ML IJ SOLN
INTRAMUSCULAR | Status: AC
  Filled 2021-09-06: qty 1

## 2021-09-06 MED ORDER — ROCURONIUM BROMIDE 100 MG/10ML IV SOLN
INTRAVENOUS | Status: DC | PRN
  Administered 2021-09-06 (×2): 30 mg via INTRAVENOUS
  Administered 2021-09-06: 70 mg via INTRAVENOUS

## 2021-09-06 MED ORDER — SODIUM CHLORIDE 0.9 % IR SOLN
Status: DC | PRN
  Administered 2021-09-06: 200 mL

## 2021-09-06 MED ORDER — ONDANSETRON HCL 4 MG/2ML IJ SOLN
INTRAMUSCULAR | Status: AC
  Filled 2021-09-06: qty 2

## 2021-09-06 MED ORDER — HYDROXYCHLOROQUINE SULFATE 200 MG PO TABS
200.0000 mg | ORAL_TABLET | Freq: Every day | ORAL | Status: DC
Start: 2021-09-06 — End: 2021-09-08
  Administered 2021-09-06 – 2021-09-07 (×2): 200 mg via ORAL
  Filled 2021-09-06 (×2): qty 1

## 2021-09-06 MED ORDER — CHLORHEXIDINE GLUCONATE 0.12 % MT SOLN
OROMUCOSAL | Status: AC
  Administered 2021-09-06: 15 mL via OROMUCOSAL
  Filled 2021-09-06: qty 15

## 2021-09-06 MED ORDER — ONDANSETRON HCL 4 MG/2ML IJ SOLN: 4.0000 mg | Freq: Four times a day (QID) | INTRAMUSCULAR | Status: DC | PRN

## 2021-09-06 MED ORDER — ONDANSETRON 4 MG PO TBDP: 4.0000 mg | ORAL_TABLET | Freq: Four times a day (QID) | ORAL | Status: DC | PRN

## 2021-09-06 MED ORDER — BUPIVACAINE HCL (PF) 0.5 % IJ SOLN
INTRAMUSCULAR | Status: DC | PRN
  Administered 2021-09-06: 30 mL

## 2021-09-06 MED ORDER — ACETAMINOPHEN 10 MG/ML IV SOLN
INTRAVENOUS | Status: AC
  Filled 2021-09-06: qty 100

## 2021-09-06 MED ORDER — DEXAMETHASONE SODIUM PHOSPHATE 10 MG/ML IJ SOLN
INTRAMUSCULAR | Status: DC | PRN
  Administered 2021-09-06: 10 mg via INTRAVENOUS

## 2021-09-06 MED ORDER — SODIUM CHLORIDE 0.9 % IV SOLN
INTRAVENOUS | Status: AC
  Filled 2021-09-06: qty 2

## 2021-09-06 MED ORDER — PROPOFOL 500 MG/50ML IV EMUL
INTRAVENOUS | Status: DC | PRN
  Administered 2021-09-06: 100 ug/kg/min via INTRAVENOUS

## 2021-09-06 MED ORDER — EPHEDRINE SULFATE (PRESSORS) 50 MG/ML IJ SOLN
INTRAMUSCULAR | Status: DC | PRN
Start: 2021-09-06 — End: 2021-09-06
  Administered 2021-09-06 (×2): 10 mg via INTRAVENOUS

## 2021-09-06 MED ORDER — DROPERIDOL 2.5 MG/ML IJ SOLN: 0.6250 mg | Freq: Once | INTRAMUSCULAR | Status: DC | PRN

## 2021-09-06 MED ORDER — LACTATED RINGERS IV SOLN: INTRAVENOUS | Status: DC

## 2021-09-06 MED ORDER — DEXMEDETOMIDINE HCL IN NACL 80 MCG/20ML IV SOLN
INTRAVENOUS | Status: AC
  Filled 2021-09-06: qty 20

## 2021-09-06 MED ORDER — PANTOPRAZOLE SODIUM 40 MG IV SOLR
40.0000 mg | Freq: Every day | INTRAVENOUS | Status: DC
  Administered 2021-09-06 – 2021-09-07 (×2): 40 mg via INTRAVENOUS
  Filled 2021-09-06 (×2): qty 10

## 2021-09-06 MED ORDER — LIDOCAINE HCL (CARDIAC) PF 100 MG/5ML IV SOSY
PREFILLED_SYRINGE | INTRAVENOUS | Status: DC | PRN
  Administered 2021-09-06: 100 mg via INTRAVENOUS

## 2021-09-06 MED ORDER — PHENYLEPHRINE 80 MCG/ML (10ML) SYRINGE FOR IV PUSH (FOR BLOOD PRESSURE SUPPORT)
PREFILLED_SYRINGE | INTRAVENOUS | Status: AC
  Filled 2021-09-06: qty 10

## 2021-09-06 MED ORDER — PROPOFOL 10 MG/ML IV BOLUS
INTRAVENOUS | Status: DC | PRN
  Administered 2021-09-06: 150 mg via INTRAVENOUS

## 2021-09-06 MED ORDER — UPADACITINIB ER 15 MG PO TB24: 15.0000 mg | ORAL_TABLET | Freq: Every morning | ORAL | Status: DC

## 2021-09-06 MED ORDER — HYDROCODONE-ACETAMINOPHEN 5-325 MG PO TABS
1.0000 | ORAL_TABLET | ORAL | Status: DC | PRN
  Administered 2021-09-07: 1 via ORAL
  Filled 2021-09-06: qty 1

## 2021-09-06 MED ORDER — CHLORHEXIDINE GLUCONATE 0.12 % MT SOLN: 15.0000 mL | Freq: Once | OROMUCOSAL | Status: AC

## 2021-09-06 SURGICAL SUPPLY — 97 items
APPLICATOR VISTASEAL 35 (MISCELLANEOUS) ×1 IMPLANT
BLADE CLIPPER SURG (BLADE) ×1 IMPLANT
BLADE SURG SZ10 CARB STEEL (BLADE) ×2 IMPLANT
BLADE SURG SZ11 CARB STEEL (BLADE) ×2 IMPLANT
CANNULA REDUC XI 12-8 STAPL (CANNULA) ×1
CANNULA REDUCER 12-8 DVNC XI (CANNULA) ×1 IMPLANT
COVER TIP SHEARS 8 DVNC (MISCELLANEOUS) ×1 IMPLANT
COVER TIP SHEARS 8MM DA VINCI (MISCELLANEOUS) ×1
DERMABOND ADVANCED (GAUZE/BANDAGES/DRESSINGS) ×1
DERMABOND ADVANCED .7 DNX12 (GAUZE/BANDAGES/DRESSINGS) IMPLANT
DRAPE ARM DVNC X/XI (DISPOSABLE) ×4 IMPLANT
DRAPE COLUMN DVNC XI (DISPOSABLE) ×1 IMPLANT
DRAPE DA VINCI XI ARM (DISPOSABLE) ×4
DRAPE DA VINCI XI COLUMN (DISPOSABLE) ×1
DRAPE LEGGINS SURG 28X43 STRL (DRAPES) ×2 IMPLANT
DRAPE UNDER BUTTOCK W/FLU (DRAPES) ×2 IMPLANT
DRSG OPSITE POSTOP 4X10 (GAUZE/BANDAGES/DRESSINGS) IMPLANT
DRSG OPSITE POSTOP 4X8 (GAUZE/BANDAGES/DRESSINGS) IMPLANT
ELECT BLADE 6.5 EXT (BLADE) IMPLANT
ELECT CAUTERY BLADE 6.4 (BLADE) ×2 IMPLANT
ELECT REM PT RETURN 9FT ADLT (ELECTROSURGICAL) ×2
ELECTRODE REM PT RTRN 9FT ADLT (ELECTROSURGICAL) ×1 IMPLANT
GLOVE BIO SURGEON STRL SZ 6.5 (GLOVE) ×9 IMPLANT
GLOVE BIOGEL PI IND STRL 6.5 (GLOVE) ×3 IMPLANT
GLOVE BIOGEL PI INDICATOR 6.5 (GLOVE) ×6
GOWN STRL REUS W/ TWL LRG LVL3 (GOWN DISPOSABLE) ×6 IMPLANT
GOWN STRL REUS W/TWL LRG LVL3 (GOWN DISPOSABLE) ×6
GRASPER SUT TROCAR 14GX15 (MISCELLANEOUS) IMPLANT
HANDLE YANKAUER SUCT BULB TIP (MISCELLANEOUS) ×2 IMPLANT
IRRIGATION STRYKERFLOW (MISCELLANEOUS) IMPLANT
IRRIGATOR STRYKERFLOW (MISCELLANEOUS)
IRRIGATOR SUCT 8 DISP DVNC XI (IRRIGATION / IRRIGATOR) IMPLANT
IRRIGATOR SUCTION 8MM XI DISP (IRRIGATION / IRRIGATOR) ×1
IV NS 1000ML (IV SOLUTION) ×1
IV NS 1000ML BAXH (IV SOLUTION) IMPLANT
KIT PINK PAD W/HEAD ARE REST (MISCELLANEOUS) ×2
KIT PINK PAD W/HEAD ARM REST (MISCELLANEOUS) ×1 IMPLANT
LABEL OR SOLS (LABEL) IMPLANT
MANIFOLD NEPTUNE II (INSTRUMENTS) ×2 IMPLANT
NDL INSUFFLATION 14GA 120MM (NEEDLE) ×1 IMPLANT
NEEDLE HYPO 22GX1.5 SAFETY (NEEDLE) ×2 IMPLANT
NEEDLE INSUFFLATION 14GA 120MM (NEEDLE) ×2 IMPLANT
OBTURATOR OPTICAL STANDARD 8MM (TROCAR) ×1
OBTURATOR OPTICAL STND 8 DVNC (TROCAR) ×1
OBTURATOR OPTICALSTD 8 DVNC (TROCAR) ×1 IMPLANT
PACK COLON CLEAN CLOSURE (MISCELLANEOUS) ×2 IMPLANT
PACK LAP CHOLECYSTECTOMY (MISCELLANEOUS) ×2 IMPLANT
PENCIL ELECTRO HAND CTR (MISCELLANEOUS) ×2 IMPLANT
PORT ACCESS TROCAR AIRSEAL 5 (TROCAR) ×2 IMPLANT
RELOAD STAPLE 45 3.5 BLU DVNC (STAPLE) IMPLANT
RELOAD STAPLE 60 3.5 BLU DVNC (STAPLE) IMPLANT
RELOAD STAPLER 3.5X45 BLU DVNC (STAPLE) ×1 IMPLANT
RELOAD STAPLER 3.5X60 BLU DVNC (STAPLE) ×2 IMPLANT
RETRACTOR RING XSMALL (MISCELLANEOUS) IMPLANT
RETRACTOR WOUND ALXS 18CM SML (MISCELLANEOUS) IMPLANT
RTRCTR WOUND ALEXIS 13CM XS SH (MISCELLANEOUS)
RTRCTR WOUND ALEXIS O 18CM SML (MISCELLANEOUS) ×2
SEAL CANN UNIV 5-8 DVNC XI (MISCELLANEOUS) ×3 IMPLANT
SEAL XI 5MM-8MM UNIVERSAL (MISCELLANEOUS) ×3
SEALER VESSEL DA VINCI XI (MISCELLANEOUS) ×1
SEALER VESSEL EXT DVNC XI (MISCELLANEOUS) IMPLANT
SET TRI-LUMEN FLTR TB AIRSEAL (TUBING) ×2 IMPLANT
SOL PREP PVP 2OZ (MISCELLANEOUS) ×2
SOLUTION ELECTROLUBE (MISCELLANEOUS) ×2 IMPLANT
SOLUTION PREP PVP 2OZ (MISCELLANEOUS) ×1 IMPLANT
SPONGE T-LAP 18X18 ~~LOC~~+RFID (SPONGE) ×2 IMPLANT
SPONGE T-LAP 4X18 ~~LOC~~+RFID (SPONGE) ×2 IMPLANT
STAPLER 45 DA VINCI SURE FORM (STAPLE) ×1
STAPLER 45 SUREFORM DVNC (STAPLE) IMPLANT
STAPLER 60 DA VINCI SURE FORM (STAPLE) ×1
STAPLER 60 SUREFORM DVNC (STAPLE) IMPLANT
STAPLER CANNULA SEAL DVNC XI (STAPLE) ×1 IMPLANT
STAPLER CANNULA SEAL XI (STAPLE) ×1
STAPLER CIRCULAR MANUAL XL 29 (STAPLE) ×1 IMPLANT
STAPLER RELOAD 3.5X45 BLU DVNC (STAPLE) ×1
STAPLER RELOAD 3.5X45 BLUE (STAPLE) ×1
STAPLER RELOAD 3.5X60 BLU DVNC (STAPLE) ×2
STAPLER RELOAD 3.5X60 BLUE (STAPLE) ×2
STAPLER RELOADABLE 65 2-0 SUT (MISCELLANEOUS) IMPLANT
STAPLER SYS INTERNAL RELOAD SS (MISCELLANEOUS) ×2 IMPLANT
SURGILUBE 2OZ TUBE FLIPTOP (MISCELLANEOUS) ×2 IMPLANT
SUT MNCRL 4-0 (SUTURE) ×1
SUT MNCRL 4-0 27XMFL (SUTURE) ×1
SUT PDS PLUS 0 (SUTURE) ×1
SUT PDS PLUS AB 0 CT-2 (SUTURE) ×2 IMPLANT
SUT SILK 0 SH 30 (SUTURE) ×3 IMPLANT
SUT SILK 3-0 (SUTURE) IMPLANT
SUT VIC AB 3-0 SH 27 (SUTURE) ×2
SUT VIC AB 3-0 SH 27X BRD (SUTURE) ×4 IMPLANT
SUT VICRYL 0 AB UR-6 (SUTURE) ×4 IMPLANT
SUT VLOC 90 6 CV-15 VIOLET (SUTURE) ×2 IMPLANT
SUTURE MNCRL 4-0 27XMF (SUTURE) ×2 IMPLANT
SYR 30ML LL (SYRINGE) ×4 IMPLANT
SYS TROCAR 1.5-3 SLV ABD GEL (ENDOMECHANICALS) ×2
SYSTEM TROCR 1.5-3 SLV ABD GEL (ENDOMECHANICALS) ×1 IMPLANT
TRAY FOLEY MTR SLVR 16FR STAT (SET/KITS/TRAYS/PACK) ×2 IMPLANT
WATER STERILE IRR 500ML POUR (IV SOLUTION) ×1 IMPLANT

## 2021-09-06 NOTE — Interval H&P Note (Signed)
History and Physical Interval Note: ? ?09/06/2021 ?7:03 AM ? ?Alejandra Wilson  has presented today for surgery, with the diagnosis of C18.7- Malignant Neoplasm of Sigmoid Colon.  The various methods of treatment have been discussed with the patient and family. After consideration of risks, benefits and other options for treatment, the patient has consented to  Procedure(s): ?XI [place] (N/A) as a surgical intervention.  The patient's history has been reviewed, patient examined, no change in status, stable for surgery.  I have reviewed the patient's chart and labs.  Questions were answered to the patient's satisfaction.   ? ? ?Alejandra Wilson ? ? ?

## 2021-09-06 NOTE — Anesthesia Procedure Notes (Signed)
Procedure Name: Intubation ?Date/Time: 09/06/2021 7:45 AM ?Performed by: Jonna Clark, CRNA ?Pre-anesthesia Checklist: Patient identified, Patient being monitored, Timeout performed, Emergency Drugs available and Suction available ?Patient Re-evaluated:Patient Re-evaluated prior to induction ?Oxygen Delivery Method: Circle system utilized ?Preoxygenation: Pre-oxygenation with 100% oxygen ?Induction Type: IV induction ?Ventilation: Mask ventilation without difficulty ?Laryngoscope Size: Mac and 3 ?Grade View: Grade I ?Tube type: Oral ?Tube size: 7.0 mm ?Number of attempts: 1 ?Placement Confirmation: ETT inserted through vocal cords under direct vision, positive ETCO2 and breath sounds checked- equal and bilateral ?Secured at: 21 cm ?Tube secured with: Tape ?Dental Injury: Teeth and Oropharynx as per pre-operative assessment  ? ? ? ? ?

## 2021-09-06 NOTE — Op Note (Signed)
Preoperative diagnosis: Sigmoid Cancer ? ?Postoperative diagnosis: Sigmoid Cancer ? ?Procedure: Robotic assisted laparoscopic low anterior resection. ?  ?Anesthesia: GETA ?  ?Surgeon: Herbert Pun, MD ? ?Assistant: Dr. Lysle Pearl  ?  ?Wound Classification: Clean contaminated ?  ?Specimen: Sigmoid colon ?  ?Complications: None ?  ?Estimated Blood Loss: 100 mL ? ? ?Indications: Patient is a 66 y.o. 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 Elective resection was indicated.   ?  ?FIndings: ?1.  Sigmoid colon mass with tattoo ?2.  Adequate hemostasis.  ?3.  No gross metastasis noted ?4.  Anastomosis done at 6 cm from the anal verge ?  ?Description of procedure: The patient was placed on the operating table in the lithotomy position, both arms tucked. General anesthesia was induced. A time-out was completed verifying correct patient, procedure, site, positioning, and implant(s) and/or special equipment prior to beginning this procedure. The abdomen was prepped and draped in the usual sterile fashion.  ?  ?A Veress needle was inserted on Palmer's point.  Abdominal cavity was insufflated to 15 mmHg. Patient tolerated insufflation well.  An 8 mm port was inserted in an Optiview fashion in the right upper quadrant.  Two additional 8 mm ports and one 12 mm port were inserted under direct visualization along the right side of the abdominal wall. 5 mm assistant port was then placed on the right subcostal area.  No injuries from trocar placements were noted. The table was placed in the Trendelenburg position.  Xi robotic platform was then brought to the operative field and docked at an angle from the left lower quadrant.  Tip up grasper and fenestrated bipolar were placed in the left arm ports.  Scissors were placed in right arm port. ?  ?Examination of the abdominal cavity noted no signs of gross metastasis.  The sigmoid colon with inking was noted.   ? ?After lysis of adhesions, the sigmoid and descending colon was  mobilized from the lateral attachment following the [employer]. This was followed up to the splenic flexure.  When mobilized, a small window on the mesentery of the distal descending colon where healthy colon was identified was done. Then the mesentery was divided with Vessel Sealer device. The mesentery was divided down very low distal to the ink. At this point the low rectum was divided with stapler device. 5 mg of ICG green was flushed intravenously and the site of adequate vascularity was identified.  ? ? ?Da Vinci robot was undocked.  I scrubbed back.  Midline incision was done, dissection taken down to the fascia.  Abdominal cavity entered.  The descending colon was exteriorized.  With pursestring device, a colotomy was done and the anvil inserted.  The descending colon was placed back into the abdominal cavity.  The specimen was removed.  Abdominal cavity insufflated again. ? ?Once the descending colon was identified reaching the rectum without tension, the assistant surgeon introduced the 29 mm EEA rectally. It was guided under direct vision up to the level of the distal staple line on the rectal stump. The spike of the EEA device was then deployed to pierce the rectal stump, the anvil was then attached to this spike, and the EEA device is closed and fired to perform a stapled end-to-end anastomosis. The doughnuts produced by the EEA stapler were checked to ensure that they were complete. Furthermore, an air leak test was carried out by insufflating air gently into the rectum, while the anastomosis was bathed underwater. A clamp was placed  on the proximal colon to prevent its distention. Once the anastomosis was properly tested, the patient was repositioned back in the normal anatomical position.  Vistaseal was irrigated over the anastomosis. The trocars were removed under direct vision and the fascia of the midline was closed with a #0 STRATAFIX suture. The skin was closed with 4-0 Monocryl sutures  in a running fashion and a dry sterile dressing is applied. The sponge and instrument count were correct, blood loss was minimal, and there were no complications.  ?  ?The patient tolerated the procedure well, awakened from anesthesia and was taken to the postanesthesia care unit in satisfactory condition.  Foley still in place.  Sponge count and instrument count correct at the end of the procedure. ? ?

## 2021-09-06 NOTE — Transfer of Care (Signed)
Immediate Anesthesia Transfer of Care Note ? ?Patient: Alejandra Wilson ? ?Procedure(s) Performed: XI ROBOT ASSISTED LAPAROSCOPIC PARTIAL COLECTOMY (Abdomen) ? ?Patient Location: PACU ? ?Anesthesia Type:General ? ?Level of Consciousness: drowsy and patient cooperative ? ?Airway & Oxygen Therapy: Patient Spontanous Breathing ? ?Post-op Assessment: Report given to RN and Post -op Vital signs reviewed and stable ? ?Post vital signs: Reviewed and stable ? ?Last Vitals:  ?Vitals Value Taken Time  ?BP 114/60 09/06/21 1323  ?Temp 36.2 ?C 09/06/21 1323  ?Pulse 71 09/06/21 1329  ?Resp 22 09/06/21 1329  ?SpO2 98 % 09/06/21 1329  ?Vitals shown include unvalidated device data. ? ?Last Pain:  ?Vitals:  ? 09/06/21 1323  ?TempSrc:   ?PainSc: Asleep  ?   ? ?  ? ?Complications: No notable events documented. ?

## 2021-09-06 NOTE — Anesthesia Preprocedure Evaluation (Addendum)
Anesthesia Evaluation  ?Patient identified by MRN, date of birth, ID band ?Patient awake ? ? ? ?Reviewed: ?Allergy & Precautions, NPO status , Patient's Chart, lab work & pertinent test results ? ?History of Anesthesia Complications ?(+) PONV and history of anesthetic complications ? ?Airway ?Mallampati: II ? ?TM Distance: >3 FB ?Neck ROM: Full ? ? ? Dental ? ?(+) Partial Lower, Partial Upper ?  ?Pulmonary ?neg sleep apnea, neg COPD, Not current smoker and Patient did not abstain from smoking., former smoker,  ?  ?Pulmonary exam normal ?breath sounds clear to auscultation ? ? ? ? ? ? Cardiovascular ?Exercise Tolerance: Good ?METS(-) hypertension(-) CAD and (-) Past MI negative cardio ROS ? ?(-) dysrhythmias  ?Rhythm:Regular Rate:Normal ?- Systolic murmurs ? ?  ?Neuro/Psych ?negative neurological ROS ? negative psych ROS  ? GI/Hepatic ?GERD  Medicated and Controlled,(+)  ?  ? (-) substance abuse ? , Malignant Neoplasm of Sigmoid Colon ?  ?Endo/Other  ?neg diabetesHypothyroidism  ? Renal/GU ?negative Renal ROS  ? ?  ?Musculoskeletal ? ?(+) Arthritis , Rheumatoid disorders,   ? Abdominal ?(+) + obese,   ?Peds ? Hematology ?  ?Anesthesia Other Findings ?Past Medical History: ?No date: GERD (gastroesophageal reflux disease) ?No date: Hyperlipidemia ?No date: Hypothyroidism ?No date: Insomnia ?No date: Osteoarthritis of right knee ?No date: Rheumatoid arthritis involving multiple sites with positive  ?rheumatoid factor ([place]) ? Reproductive/Obstetrics ? ?  ? ? ? ? ? ? ? ? ? ? ? ? ? ?  ?  ? ? ? ? ? ? ?Anesthesia Physical ? ?Anesthesia Plan ? ?ASA: 2 ? ?Anesthesia Plan: General  ? ?Post-op Pain Management: Toradol IV (intra-op)* and Ofirmev IV (intra-op)*  ? ?Induction: Intravenous ? ?PONV Risk Score and Plan: 3 and Ondansetron, Dexamethasone, TIVA and Aprepitant ? ?Airway Management Planned: Oral ETT ? ?Additional Equipment:  ? ?Intra-op Plan:  ? ?Post-operative Plan: Extubation in  OR ? ?Informed Consent: I have reviewed the patients History and Physical, chart, labs and discussed the procedure including the risks, benefits and alternatives for the proposed anesthesia with the patient or authorized representative who has indicated his/her understanding and acceptance.  ? ? ? ?Dental advisory given ? ?Plan Discussed with: CRNA and Surgeon ? ?Anesthesia Plan Comments:   ? ? ? ? ?Anesthesia Quick Evaluation ? ?

## 2021-09-07 LAB — CBC
HCT: 32.4 % — ABNORMAL LOW (ref 36.0–46.0)
Hemoglobin: 10.8 g/dL — ABNORMAL LOW (ref 12.0–15.0)
MCH: 29.7 pg (ref 26.0–34.0)
MCHC: 33.3 g/dL (ref 30.0–36.0)
MCV: 89 fL (ref 80.0–100.0)
Platelets: 295 10*3/uL (ref 150–400)
RBC: 3.64 MIL/uL — ABNORMAL LOW (ref 3.87–5.11)
RDW: 12.9 % (ref 11.5–15.5)
WBC: 10.1 10*3/uL (ref 4.0–10.5)
nRBC: 0 % (ref 0.0–0.2)

## 2021-09-07 LAB — BASIC METABOLIC PANEL
Anion gap: 8 (ref 5–15)
BUN: 11 mg/dL (ref 8–23)
CO2: 23 mmol/L (ref 22–32)
Calcium: 8.4 mg/dL — ABNORMAL LOW (ref 8.9–10.3)
Chloride: 108 mmol/L (ref 98–111)
Creatinine, Ser: 0.81 mg/dL (ref 0.44–1.00)
GFR, Estimated: >60 mL/min (ref >60)
Glucose, Bld: 137 mg/dL — ABNORMAL HIGH (ref 70–99)
Potassium: 3.9 mmol/L (ref 3.5–5.1)
Sodium: 139 mmol/L (ref 135–145)

## 2021-09-07 NOTE — TOC Initial Note (Signed)
Transition of Care [facility]) - Initial/Assessment Note    Patient Details  Name: Alejandra Wilson MRN: [id number] Date of Birth: 07-19-1955  Transition of Care [facility]) CM/SW Contact:    Beverly Sessions, RN Phone Number: 09/07/2021, 3:55 PM  Clinical Narrative:                   Transition of Care [facility]) Screening Note   Patient Details  Name: Alejandra Wilson Date of Birth: 01/12/1956   Transition of Care [facility]) CM/SW Contact:    Beverly Sessions, RN Phone Number: 09/07/2021, 3:55 PM    Transition of Care Department [facility]) has reviewed patient and no TOC needs have been identified at this time. We will continue to monitor patient advancement through interdisciplinary progression rounds. If new patient transition needs arise, please place a TOC consult.         Patient Goals and CMS Choice        Expected Discharge Plan and Services                                                Prior Living Arrangements/Services                       Activities of Daily Living Home Assistive Devices/Equipment: None ADL Screening (condition at time of admission) Patient's cognitive ability adequate to safely complete daily activities?: Yes Is the patient deaf or have difficulty hearing?: No Does the patient have difficulty seeing, even when wearing glasses/contacts?: No Does the patient have difficulty concentrating, remembering, or making decisions?: No Patient able to express need for assistance with ADLs?: Yes Does the patient have difficulty dressing or bathing?: No Independently performs ADLs?: Yes (appropriate for developmental age) Does the patient have difficulty walking or climbing stairs?: No Weakness of Legs: None Weakness of Arms/Hands: None  Permission Sought/Granted                  Emotional Assessment              Admission diagnosis:  Colon cancer [facility]) [C18.9] Patient Active Problem List   Diagnosis Date Noted   Colon  cancer ([place]) 09/06/2021   Allergic rhinitis 08/23/2021   Hyperlipidemia 08/23/2021   Hypothyroidism 08/23/2021   Primary insomnia 08/21/2016   PCP:  Marinda Elk, MD Pharmacy:   [facility] [address]), [place] [address] [place]) [place] [address] Phone: [phone] Fax: [phone]     Social Determinants of Health (SDOH) Interventions    Readmission Risk Interventions     View : No data to display.

## 2021-09-07 NOTE — Progress Notes (Signed)
Mobility Specialist - Progress Note    09/07/21 1100  Mobility  Activity Ambulated independently in hallway;Stood at bedside;Dangled on edge of bed  Level of Assistance Independent  Assistive Device None  Distance Ambulated (ft) 200 ft  Activity Response Tolerated well  $Mobility charge 1 Mobility     Pre-mobility: HR, BP, SpO2 During mobility: HR, BP, SpO2 Post-mobility: HR, BP, SPO2  Pt ambulates in hallway indep voicing mild abdominal pain but tolerates well. Pt returns to room with needs in reach.  Merrily Brittle Mobility Specialist 09/07/21, 12:02 PM

## 2021-09-07 NOTE — Plan of Care (Signed)

## 2021-09-07 NOTE — Progress Notes (Signed)
Patient ID: Alejandra Wilson, female   DOB: February 17, 1956, 66 y.o.   MRN: [id number]     [place] Hospital Day(s): 1.   Interval History: Patient seen and examined, no acute events or new complaints overnight. Patient reports feeling well.  Patient denies significant abdominal pain.  Patient endorses tolerating diet.  Patient endorses passing gas.  Vital signs in last 24 hours: [min-max] current  Temp:  [98.1 F (36.7 C)-98.5 F (36.9 C)] 98.1 F (36.7 C) (05/18 0806) Pulse Rate:  [70-80] 70 (05/18 0806) Resp:  [20] 20 (05/18 0806) BP: (114-122)/(47-76) 122/67 (05/18 0806) SpO2:  [98 %-100 %] 100 % (05/18 0806)     Height: '5\' 4"'$  (162.6 cm) Weight: 88.8 kg BMI (Calculated): 33.59   Physical Exam:  Constitutional: alert, cooperative and no distress  Respiratory: breathing non-labored at rest  Cardiovascular: regular rate and sinus rhythm  Gastrointestinal: soft, non-tender, and non-distended  Labs:     Latest Ref Rng & Units 09/07/2021    5:07 AM 08/23/2021    3:46 PM 12/09/2020    8:44 AM  CBC  WBC 4.0 - 10.5 K/uL 10.1   5.7   5.8    Hemoglobin 12.0 - 15.0 g/dL 10.8   13.0   12.2    Hematocrit 36.0 - 46.0 % 32.4   38.3   36.0    Platelets 150 - 400 K/uL 295   363   306        Latest Ref Rng & Units 09/07/2021    5:07 AM 08/23/2021    3:46 PM 12/09/2020    8:44 AM  CMP  Glucose 70 - 99 mg/dL 137   107   92    BUN 8 - 23 mg/dL '11   13   28    '$ Creatinine 0.44 - 1.00 mg/dL 0.81   1.00   0.77    Sodium 135 - 145 mmol/L 139   139   139    Potassium 3.5 - 5.1 mmol/L 3.9   3.9   3.7    Chloride 98 - 111 mmol/L 108   106   108    CO2 22 - 32 mmol/L '23   26   27    '$ Calcium 8.9 - 10.3 mg/dL 8.4   9.2   8.7    Total Protein 6.5 - 8.1 g/dL  7.9   6.9    Total Bilirubin 0.3 - 1.2 mg/dL  1.0   1.0    Alkaline Phos 38 - 126 U/L  88   72    AST 15 - 41 U/L  27   21    ALT 0 - 44 U/L  31   24      Imaging studies: No new pertinent imaging studies   Assessment/Plan:  66  y.o. female with malignancy of the sigmoid colon 1 Day Post-Op s/p robotic assisted laparoscopic partial colectomy.  Patient is recovering adequately.  Today the pain is controlled.  Vital signs are adequate.  Patient is passing gas and having bowel movements.  I will advance her diet as tolerated.  Encourage patient to ambulate.  Arnold Long, MD

## 2021-09-08 ENCOUNTER — Other Ambulatory Visit: Payer: Self-pay | Admitting: Anatomic Pathology & Clinical Pathology

## 2021-09-08 LAB — BASIC METABOLIC PANEL
Anion gap: 7 (ref 5–15)
BUN: 10 mg/dL (ref 8–23)
CO2: 24 mmol/L (ref 22–32)
Calcium: 8.2 mg/dL — ABNORMAL LOW (ref 8.9–10.3)
Chloride: 110 mmol/L (ref 98–111)
Creatinine, Ser: 0.82 mg/dL (ref 0.44–1.00)
GFR, Estimated: >60 mL/min (ref >60)
Glucose, Bld: 108 mg/dL — ABNORMAL HIGH (ref 70–99)
Potassium: 3.5 mmol/L (ref 3.5–5.1)
Sodium: 141 mmol/L (ref 135–145)

## 2021-09-08 LAB — CBC
HCT: 29.9 % — ABNORMAL LOW (ref 36.0–46.0)
Hemoglobin: 9.4 g/dL — ABNORMAL LOW (ref 12.0–15.0)
MCH: 28.4 pg (ref 26.0–34.0)
MCHC: 31.4 g/dL (ref 30.0–36.0)
MCV: 90.3 fL (ref 80.0–100.0)
Platelets: 271 10*3/uL (ref 150–400)
RBC: 3.31 MIL/uL — ABNORMAL LOW (ref 3.87–5.11)
RDW: 13.3 % (ref 11.5–15.5)
WBC: 6.7 10*3/uL (ref 4.0–10.5)
nRBC: 0 % (ref 0.0–0.2)

## 2021-09-08 MED ORDER — HYDROCODONE-ACETAMINOPHEN 5-325 MG PO TABS
1.0000 | ORAL_TABLET | ORAL | 0 refills | Status: AC | PRN
Start: 2021-09-08 — End: 2021-09-11

## 2021-09-08 NOTE — Discharge Instructions (Signed)

## 2021-09-08 NOTE — Anesthesia Postprocedure Evaluation (Signed)
Anesthesia Post Note  Patient: Alejandra Wilson  Procedure(s) Performed: XI ROBOT ASSISTED LAPAROSCOPIC PARTIAL COLECTOMY (Abdomen)  Patient location during evaluation: PACU Anesthesia Type: General Level of consciousness: awake and alert Pain management: pain level controlled Vital Signs Assessment: post-procedure vital signs reviewed and stable Respiratory status: spontaneous breathing, nonlabored ventilation and respiratory function stable Cardiovascular status: blood pressure returned to baseline and stable Postop Assessment: no apparent nausea or vomiting Anesthetic complications: no   No notable events documented.   Last Vitals:  Vitals:   09/07/21 1940 09/08/21 0346  BP: 116/68 125/65  Pulse: 72 (!) 58  Resp: 20 17  Temp: 36.8 C 36.5 C  SpO2: 100% 98%    Last Pain:  Vitals:   09/08/21 0346  TempSrc: Oral  PainSc:                  [place] Ouch

## 2021-09-08 NOTE — Discharge Summary (Signed)
  Patient ID: Alejandra Wilson MRN: [id number] DOB/AGE: 1955-05-02 66 y.o.  Admit date: 09/06/2021 Discharge date: 09/08/2021   Discharge Diagnoses:  Principal Problem:   Colon cancer [facility])   Procedures: Robotic assisted laparoscopic partial colectomy with anastomosis  Hospital Course: Patient admitted with malignancy of the sigmoid colon.  She underwent robotic assisted laparoscopic partial colectomy.  She has been recovering adequately.  She has had pain controlled.  She is passing gas.  She is ambulating.  She is tolerating solid diet.  Incisions are dry and clean.  Physical Exam HENT:     Head: Normocephalic.  Cardiovascular:     Rate and Rhythm: Normal rate and regular rhythm.     Pulses: Normal pulses.  Pulmonary:     Effort: Pulmonary effort is normal.  Abdominal:     General: Abdomen is flat.  Musculoskeletal:     Cervical back: Normal range of motion.  Skin:    Capillary Refill: Capillary refill takes less than 2 seconds.  Neurological:     Mental Status: She is alert and oriented to person, place, and time.     Consults: None  Disposition: Discharge disposition: 01-Home or Self Care       Discharge Instructions     Diet - low sodium heart healthy   Complete by: As directed    Increase activity slowly   Complete by: As directed       Allergies as of 09/08/2021       Reactions   Codeine Nausea Only        Medication List     STOP taking these medications    ciprofloxacin 500 MG tablet Commonly known as: CIPRO       TAKE these medications    amoxicillin 500 MG capsule Commonly known as: AMOXIL Take 500 mg by mouth 3 (three) times daily. Pt states only for dentist appointments.   bisacodyl 5 MG EC tablet Commonly known as: DULCOLAX Take 20 mg by mouth once.   diclofenac 50 MG EC tablet Commonly known as: VOLTAREN Take 50 mg by mouth daily.   HYDROcodone-acetaminophen 5-325 MG tablet Commonly known as: Norco Take 1 tablet by  mouth every 4 (four) hours as needed for up to 3 days for moderate pain.   hydroxychloroquine 200 MG tablet Commonly known as: PLAQUENIL Take 200 mg by mouth at bedtime.   levothyroxine 100 MCG tablet Commonly known as: SYNTHROID Take 100 mcg by mouth daily before breakfast.   metroNIDAZOLE 500 MG tablet Commonly known as: FLAGYL Take 2 tablets at 2 pm, 3 pm and 10 pm the day before surgery.   omeprazole 20 MG capsule Commonly known as: PRILOSEC Take 20 mg by mouth daily.   polyethylene glycol powder 17 GM/SCOOP powder Commonly known as: GLYCOLAX/MIRALAX Take 0.5 Containers by mouth once.   Rinvoq 15 MG Tb24 Generic drug: Upadacitinib ER Take 15 mg by mouth in the morning.   simvastatin 20 MG tablet Commonly known as: ZOCOR Take 20 mg by mouth at bedtime.        Follow-up Information     Herbert Pun, MD Follow up in 2 week(s).   Specialty: General Surgery Contact information: [address] [place] [address] [phone]

## 2021-09-08 NOTE — Progress Notes (Signed)
Discharge instructions reviewed with patient and family members at the bedside. Questions and concerns were address. Patient states she has no further questions or concerns at this time. PIV removed with no complications. Patient is safe and stable to DC home with husband at this time.

## 2021-09-14 ENCOUNTER — Other Ambulatory Visit: Payer: BC Managed Care – PPO

## 2021-09-14 NOTE — Progress Notes (Signed)
Tumor Board Documentation  Alejandra Wilson was presented by Dr Janese Banks at our Tumor Board on 09/14/2021, which included representatives from surgical, medical oncology, pharmacy, pulmonology, radiology, radiation oncology, pathology, navigation, research, internal medicine, palliative care.  Alejandra Wilson currently presents as a current patient, for Alejandra Wilson, for new positive pathology with history of the following treatments: surgical intervention(s).  Additionally, we reviewed previous medical and familial history, history of present illness, and recent lab results along with all available histopathologic and imaging studies. The tumor board considered available treatment options and made the following recommendations: Active surveillance    The following procedures/referrals were also placed: No orders of the defined types were placed in this encounter.   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  Tumor board is a meeting of clinicians from various specialty areas who evaluate and discuss patients for whom a multidisciplinary approach is being considered. Final determinations in the plan of care are those of the provider(s). The responsibility for follow up of recommendations given during tumor board is that of the provider.   Today's extended care, comprehensive team conference, Qunisha was not present for the discussion and was not examined.   Multidisciplinary Tumor Board is a multidisciplinary case peer review process.  Decisions discussed in the Multidisciplinary Tumor Board reflect the opinions of the specialists present at the conference without having examined the patient.  Ultimately, treatment and diagnostic decisions rest with the primary provider(s) and the patient.

## 2021-09-19 LAB — SURGICAL PATHOLOGY

## 2021-10-03 ENCOUNTER — Encounter: Payer: Self-pay | Admitting: Oncology

## 2021-10-03 ENCOUNTER — Inpatient Hospital Stay: Payer: BC Managed Care – PPO | Attending: Oncology | Admitting: Oncology

## 2021-10-03 VITALS — BP 166/73 | HR 79 | Temp 98.1°F | Resp 16 | Wt 194.4 lb

## 2021-10-03 DIAGNOSIS — Z87891 Personal history of nicotine dependence: Secondary | ICD-10-CM | POA: Insufficient documentation

## 2021-10-03 DIAGNOSIS — D649 Anemia, unspecified: Secondary | ICD-10-CM | POA: Insufficient documentation

## 2021-10-03 DIAGNOSIS — C19 Malignant neoplasm of rectosigmoid junction: Secondary | ICD-10-CM | POA: Insufficient documentation

## 2021-10-03 DIAGNOSIS — C187 Malignant neoplasm of sigmoid colon: Secondary | ICD-10-CM | POA: Diagnosis not present

## 2021-10-03 DIAGNOSIS — Z7189 Other specified counseling: Secondary | ICD-10-CM

## 2021-10-04 NOTE — Progress Notes (Signed)
Hematology/Oncology Consult note [facility]  Telephone:([phone] Fax:([phone]  Patient Care Team: Marinda Elk, MD as PCP - General (Physician Assistant) Clent Jacks, RN as Oncology Nurse Navigator   Name of the patient: Alejandra Wilson  [id number]  1955/06/09   Date of visit: 10/04/21  Diagnosis-stage II colon cancer  Chief complaint/ Reason for visit-discuss final pathology results and further management  Heme/Onc history:  Oncology History Overview Note  Rectal bleeding in November 2022.  Colonoscopy on 08/21/2021 showed 22 mm polypoid lesion in the rectosigmoid colon 18 cm from the anal verge.  Biopsy showed moderately differentiated adenocarcinoma.  CT chest abdomen and pelvis showed no evidence of metastatic disease.  Patient underwent rectosigmoid resection by Dr. Peyton Najjar on 09/06/2021 which showed moderately differentiated adenocarcinoma invading through muscularis propria into pericolonic tissue.  3 colonic lymph nodes negative for malignancy.  Margins negative.  Lymphovascular invasion not identified.  Perineural invasion not identified.  PT3 pN0.  MSI stable.     Colon cancer ([place])  09/06/2021 Initial Diagnosis   Colon cancer ([place])   10/04/2021 Cancer Staging   Staging form: Colon and Rectum, AJCC 8th Edition - Pathologic stage from 10/04/2021: Stage IIA (pT3, pN0, cM0) - Signed by Sindy Guadeloupe, MD on 10/04/2021 Total positive nodes: 0 Histologic grading system: 4 grade system Histologic grade (G): G2 Residual tumor (R): R0 - None      Interval history-patient is healing well after her surgery.  Reports no complaints at this time.  Bowel movements are regular  ECOG PS- 0 Pain scale- 0   Review of systems- Review of Systems  Constitutional:  Negative for chills, fever, malaise/fatigue and weight loss.  HENT:  Negative for congestion, ear discharge and nosebleeds.   Eyes:  Negative for blurred vision.   Respiratory:  Negative for cough, hemoptysis, sputum production, shortness of breath and wheezing.   Cardiovascular:  Negative for chest pain, palpitations, orthopnea and claudication.  Gastrointestinal:  Negative for abdominal pain, blood in stool, constipation, diarrhea, heartburn, melena, nausea and vomiting.  Genitourinary:  Negative for dysuria, flank pain, frequency, hematuria and urgency.  Musculoskeletal:  Negative for back pain, joint pain and myalgias.  Skin:  Negative for rash.  Neurological:  Negative for dizziness, tingling, focal weakness, seizures, weakness and headaches.  Endo/Heme/Allergies:  Does not bruise/bleed easily.  Psychiatric/Behavioral:  Negative for depression and suicidal ideas. The patient does not have insomnia.       Allergies  Allergen Reactions   Codeine Nausea Only     Past Medical History:  Diagnosis Date   GERD (gastroesophageal reflux disease)    Hyperlipidemia    Hypothyroidism    Insomnia    Osteoarthritis of right knee    Rheumatoid arthritis involving multiple sites with positive rheumatoid factor ([place])      Past Surgical History:  Procedure Laterality Date   COLONOSCOPY WITH PROPOFOL N/A 08/21/2021   Procedure: COLONOSCOPY WITH PROPOFOL;  Surgeon: Lesly Rubenstein, MD;  Location: [facility] ENDOSCOPY;  Service: Endoscopy;  Laterality: N/A;   KNEE CLOSED REDUCTION Right 02/09/2021   Procedure: Right knee manipulation with steroid injection;  Surgeon: Corky Mull, MD;  Location: [facility] ORS;  Service: Orthopedics;  Laterality: Right;   OVARIAN CYST REMOVAL     PARTIAL KNEE ARTHROPLASTY Left 07/09/2019   Procedure: UNICOMPARTMENTAL KNEE;  Surgeon: Corky Mull, MD;  Location: [facility] ORS;  Service: Orthopedics;  Laterality: Left;   PARTIAL KNEE ARTHROPLASTY Right 12/20/2020   Procedure:  RIGHT PARTIAL KNEE REPLACEMENT;  Surgeon: Corky Mull, MD;  Location: [facility] ORS;  Service: Orthopedics;  Laterality: Right;    Social History    Socioeconomic History   Marital status: Married    Spouse name: [name]   Number of children: 2   Years of education: Not on file   Highest education level: Not on file  Occupational History   Not on file  Tobacco Use   Smoking status: Former    Types: Cigarettes   Smokeless tobacco: Never  Vaping Use   Vaping Use: Some days   Substances: Nicotine  Substance and Sexual Activity   Alcohol use: No   Drug use: Never   Sexual activity: Not on file  Other Topics Concern   Not on file  Social History Narrative   Lives at home with husband and son.   Social Determinants of Health   Financial Resource Strain: Not on file  Food Insecurity: Not on file  Transportation Needs: Not on file  Physical Activity: Not on file  Stress: Not on file  Social Connections: Not on file  Intimate Partner Violence: Not on file    Family History  Problem Relation Age of Onset   Diabetes Mother    Heart disease Father    Breast cancer Neg Hx      Current Outpatient Medications:    diclofenac (VOLTAREN) 50 MG EC tablet, Take 50 mg by mouth daily., Disp: , Rfl:    hydroxychloroquine (PLAQUENIL) 200 MG tablet, Take 200 mg by mouth at bedtime., Disp: , Rfl:    levothyroxine (SYNTHROID) 100 MCG tablet, Take 100 mcg by mouth daily before breakfast., Disp: , Rfl:    omeprazole (PRILOSEC) 20 MG capsule, Take 20 mg by mouth daily., Disp: , Rfl:    RINVOQ 15 MG TB24, Take 15 mg by mouth in the morning., Disp: , Rfl:    simvastatin (ZOCOR) 20 MG tablet, Take 20 mg by mouth at bedtime., Disp: , Rfl:    amoxicillin (AMOXIL) 500 MG capsule, Take 500 mg by mouth 3 (three) times daily. Pt states only for dentist appointments. (Patient not taking: Reported on 10/03/2021), Disp: , Rfl:   Physical exam:  Vitals:   10/03/21 1124  BP: (!) 166/73  Pulse: 79  Resp: 16  Temp: 98.1 F (36.7 C)  SpO2: 100%  Weight: 194 lb 6.4 oz (88.2 kg)   Physical Exam Cardiovascular:     Rate and Rhythm: Normal rate  and regular rhythm.     Heart sounds: Normal heart sounds.  Pulmonary:     Effort: Pulmonary effort is normal.     Breath sounds: Normal breath sounds.  Abdominal:     General: Bowel sounds are normal.     Palpations: Abdomen is soft.     Comments: Scars from laparoscopic surgery are healing well  Skin:    General: Skin is warm and dry.  Neurological:     Mental Status: She is alert and oriented to person, place, and time.         Latest Ref Rng & Units 09/08/2021    4:02 AM  CMP  Glucose 70 - 99 mg/dL 108   BUN 8 - 23 mg/dL 10   Creatinine 0.44 - 1.00 mg/dL 0.82   Sodium 135 - 145 mmol/L 141   Potassium 3.5 - 5.1 mmol/L 3.5   Chloride 98 - 111 mmol/L 110   CO2 22 - 32 mmol/L 24   Calcium 8.9 - 10.3 mg/dL 8.2  Latest Ref Rng & Units 09/08/2021    4:02 AM  CBC  WBC 4.0 - 10.5 K/uL 6.7   Hemoglobin 12.0 - 15.0 g/dL 9.4   Hematocrit 36.0 - 46.0 % 29.9   Platelets 150 - 400 K/uL 271      Assessment and plan- Patient is a 66 y.o. female with history of stage IIA T3N0M0 colon cancer s/p surgery here to discuss final pathology results and further management  Final pathology shows that this was a sigmoid cancer that was moderately differentiated PT3 lesion with negative margins.  She did not have any known risk factors such as obstruction perforation, lymphovascular invasion or perineural invasion.  MSI is stable.  The only risk factor was the fact that there were less than 12 lymph nodes examined.  If more than 12 lymph nodes were examined and there was no evidence of nodal involvement then she would not have benefited from adjuvant chemotherapy.  However with the uncertainty of lymph node involvement, adjuvant chemotherapy is a consideration.  However patient understands the pros and cons and does not wish to pursue any adjuvant chemotherapy which is not entirely unreasonable given that she does not have any other risk factors as well.  I am therefore holding off on any  adjuvant chemotherapy at this time.  I will see her back in 6 months with labs including CBC with differential CMP CEA and surveillance imaging.  Patient will require a surveillance colonoscopy a year from surgery.  Treatment will be given with a curative intent.  Patient verbalized understanding of the plan  Normocytic anemia has improved and her last hemoglobin was 11.5.  Continue to monitor   Cancer Staging  Colon cancer [facility]) Staging form: Colon and Rectum, AJCC 8th Edition - Pathologic stage from 10/04/2021: Stage IIA (pT3, pN0, cM0) - Signed by Sindy Guadeloupe, MD on 10/04/2021 Total positive nodes: 0 Histologic grading system: 4 grade system Histologic grade (G): G2 Residual tumor (R): R0 - None       Visit Diagnosis 1. Goals of care, counseling/discussion   2. Malignant neoplasm of sigmoid colon ([place])      Dr. Randa Evens, MD, MPH [facility] at [facility] [phone] 10/04/2021 5:13 PM

## 2022-04-04 ENCOUNTER — Ambulatory Visit
Admission: RE | Admit: 2022-04-04 | Discharge: 2022-04-04 | Disposition: A | Discharge status: Home / Self Care | Payer: BC Managed Care – PPO | Source: Ambulatory Visit | Attending: Oncology | Admitting: Oncology

## 2022-04-04 DIAGNOSIS — C187 Malignant neoplasm of sigmoid colon: Secondary | ICD-10-CM | POA: Diagnosis not present

## 2022-04-04 MED ORDER — IOHEXOL 300 MG/ML  SOLN
100.0000 mL | Freq: Once | INTRAMUSCULAR | Status: AC | PRN
Start: 2022-04-04 — End: 2022-04-04
  Administered 2022-04-04: 100 mL via INTRAVENOUS

## 2022-04-06 ENCOUNTER — Inpatient Hospital Stay: Payer: BC Managed Care – PPO

## 2022-04-06 ENCOUNTER — Inpatient Hospital Stay: Payer: BC Managed Care – PPO | Attending: Oncology | Admitting: Oncology

## 2022-04-06 ENCOUNTER — Encounter: Payer: Self-pay | Admitting: Oncology

## 2022-04-06 VITALS — BP 166/83 | HR 72 | Temp 96.2°F | Wt 196.0 lb

## 2022-04-06 DIAGNOSIS — C19 Malignant neoplasm of rectosigmoid junction: Secondary | ICD-10-CM | POA: Insufficient documentation

## 2022-04-06 DIAGNOSIS — C187 Malignant neoplasm of sigmoid colon: Secondary | ICD-10-CM

## 2022-04-06 DIAGNOSIS — Z87891 Personal history of nicotine dependence: Secondary | ICD-10-CM | POA: Diagnosis not present

## 2022-04-06 DIAGNOSIS — Z7189 Other specified counseling: Secondary | ICD-10-CM

## 2022-04-06 LAB — CBC WITH DIFFERENTIAL/PLATELET
Abs Immature Granulocytes: 0.01 10*3/uL (ref 0.00–0.07)
Basophils Absolute: 0 10*3/uL (ref 0.0–0.1)
Basophils Relative: 1 %
Eosinophils Absolute: 0.1 10*3/uL (ref 0.0–0.5)
Eosinophils Relative: 1 %
HCT: 35.6 % — ABNORMAL LOW (ref 36.0–46.0)
Hemoglobin: 12.2 g/dL (ref 12.0–15.0)
Immature Granulocytes: 0 %
Lymphocytes Relative: 29 %
Lymphs Abs: 1.4 10*3/uL (ref 0.7–4.0)
MCH: 29.8 pg (ref 26.0–34.0)
MCHC: 34.3 g/dL (ref 30.0–36.0)
MCV: 87 fL (ref 80.0–100.0)
Monocytes Absolute: 0.7 10*3/uL (ref 0.1–1.0)
Monocytes Relative: 14 %
Neutro Abs: 2.5 10*3/uL (ref 1.7–7.7)
Neutrophils Relative %: 55 %
Platelets: 337 10*3/uL (ref 150–400)
RBC: 4.09 MIL/uL (ref 3.87–5.11)
RDW: 12.9 % (ref 11.5–15.5)
WBC: 4.6 10*3/uL (ref 4.0–10.5)
nRBC: 0 % (ref 0.0–0.2)

## 2022-04-06 LAB — COMPREHENSIVE METABOLIC PANEL
ALT: 33 U/L (ref 0–44)
AST: 26 U/L (ref 15–41)
Albumin: 4.4 g/dL (ref 3.5–5.0)
Alkaline Phosphatase: 81 U/L (ref 38–126)
Anion gap: 11 (ref 5–15)
BUN: 19 mg/dL (ref 8–23)
CO2: 23 mmol/L (ref 22–32)
Calcium: 9 mg/dL (ref 8.9–10.3)
Chloride: 105 mmol/L (ref 98–111)
Creatinine, Ser: 0.95 mg/dL (ref 0.44–1.00)
GFR, Estimated: >60 mL/min (ref >60)
Glucose, Bld: 98 mg/dL (ref 70–99)
Potassium: 4.3 mmol/L (ref 3.5–5.1)
Sodium: 139 mmol/L (ref 135–145)
Total Bilirubin: 0.5 mg/dL (ref 0.3–1.2)
Total Protein: 7.4 g/dL (ref 6.5–8.1)

## 2022-04-07 LAB — CEA: CEA: 0.9 ng/mL (ref 0.0–4.7)

## 2022-04-08 NOTE — Progress Notes (Signed)
Hematology/Oncology Consult note [facility]  Telephone:([phone] [phone]  Patient Care Team: Marinda Elk, MD as PCP - General (Physician Assistant) Clent Jacks, RN as Oncology Nurse Navigator   Name of the patient: Alejandra Wilson  [id number]  Jan 13, 1956   Date of visit: 04/08/22  Diagnosis-stage II colon cancer  Chief complaint/ Reason for visit-routine follow-up of colon cancer  Heme/Onc history:  Oncology History Overview Note  Rectal bleeding in November 2022.  Colonoscopy on 08/21/2021 showed 22 mm polypoid lesion in the rectosigmoid colon 18 cm from the anal verge.  Biopsy showed moderately differentiated adenocarcinoma.  CT chest abdomen and pelvis showed no evidence of metastatic disease.  Patient underwent rectosigmoid resection by Dr. Peyton Najjar on 09/06/2021 which showed moderately differentiated adenocarcinoma invading through muscularis propria into pericolonic tissue.  3 colonic lymph nodes negative for malignancy.  Margins negative.  Lymphovascular invasion not identified.  Perineural invasion not identified.  PT3 pN0.  MSI stable.     Colon cancer ([place])  09/06/2021 Initial Diagnosis   Colon cancer ([place])   10/04/2021 Cancer Staging   Staging form: Colon and Rectum, AJCC 8th Edition - Pathologic stage from 10/04/2021: Stage IIA (pT3, pN0, cM0) - Signed by Sindy Guadeloupe, MD on 10/04/2021 Total positive nodes: 0 Histologic grading system: 4 grade system Histologic grade (G): G2 Residual tumor (R): R0 - None      Interval history-patient is doing well presently.  Bowel movements are regular.  Appetite and weight have remained stable.  She denies any specific complaints at this time  ECOG PS- 1 Pain scale- 0   Review of systems- Review of Systems  Constitutional:  Negative for chills, fever, malaise/fatigue and weight loss.  HENT:  Negative for congestion, ear discharge and nosebleeds.   Eyes:  Negative for  blurred vision.  Respiratory:  Negative for cough, hemoptysis, sputum production, shortness of breath and wheezing.   Cardiovascular:  Negative for chest pain, palpitations, orthopnea and claudication.  Gastrointestinal:  Negative for abdominal pain, blood in stool, constipation, diarrhea, heartburn, melena, nausea and vomiting.  Genitourinary:  Negative for dysuria, flank pain, frequency, hematuria and urgency.  Musculoskeletal:  Negative for back pain, joint pain and myalgias.  Skin:  Negative for rash.  Neurological:  Negative for dizziness, tingling, focal weakness, seizures, weakness and headaches.  Endo/Heme/Allergies:  Does not bruise/bleed easily.  Psychiatric/Behavioral:  Negative for depression and suicidal ideas. The patient does not have insomnia.       Allergies  Allergen Reactions   Codeine Nausea Only     Past Medical History:  Diagnosis Date   GERD (gastroesophageal reflux disease)    Hyperlipidemia    Hypothyroidism    Insomnia    Osteoarthritis of right knee    Rheumatoid arthritis involving multiple sites with positive rheumatoid factor ([place])      Past Surgical History:  Procedure Laterality Date   COLONOSCOPY WITH PROPOFOL N/A 08/21/2021   Procedure: COLONOSCOPY WITH PROPOFOL;  Surgeon: Lesly Rubenstein, MD;  Location: [facility] ENDOSCOPY;  Service: Endoscopy;  Laterality: N/A;   KNEE CLOSED REDUCTION Right 02/09/2021   Procedure: Right knee manipulation with steroid injection;  Surgeon: Corky Mull, MD;  Location: [facility] ORS;  Service: Orthopedics;  Laterality: Right;   OVARIAN CYST REMOVAL     PARTIAL KNEE ARTHROPLASTY Left 07/09/2019   Procedure: UNICOMPARTMENTAL KNEE;  Surgeon: Corky Mull, MD;  Location: [facility] ORS;  Service: Orthopedics;  Laterality: Left;   PARTIAL KNEE ARTHROPLASTY  Right 12/20/2020   Procedure: RIGHT PARTIAL KNEE REPLACEMENT;  Surgeon: Corky Mull, MD;  Location: [facility] ORS;  Service: Orthopedics;  Laterality: Right;    Social History    Socioeconomic History   Marital status: Married    Spouse name: [name]   Number of children: 2   Years of education: Not on file   Highest education level: Not on file  Occupational History   Not on file  Tobacco Use   Smoking status: Former    Types: Cigarettes   Smokeless tobacco: Never  Vaping Use   Vaping Use: Some days   Substances: Nicotine  Substance and Sexual Activity   Alcohol use: No   Drug use: Never   Sexual activity: Not on file  Other Topics Concern   Not on file  Social History Narrative   Lives at home with husband and son.   Social Determinants of Health   Financial Resource Strain: Not on file  Food Insecurity: Not on file  Transportation Needs: Not on file  Physical Activity: Not on file  Stress: Not on file  Social Connections: Not on file  Intimate Partner Violence: Not on file    Family History  Problem Relation Age of Onset   Diabetes Mother    Heart disease Father    Breast cancer Neg Hx      Current Outpatient Medications:    diclofenac (VOLTAREN) 50 MG EC tablet, Take 50 mg by mouth daily., Disp: , Rfl:    hydroxychloroquine (PLAQUENIL) 200 MG tablet, Take 200 mg by mouth at bedtime., Disp: , Rfl:    omeprazole (PRILOSEC) 20 MG capsule, Take 20 mg by mouth daily., Disp: , Rfl:    RINVOQ 15 MG TB24, Take 15 mg by mouth in the morning., Disp: , Rfl:    simvastatin (ZOCOR) 20 MG tablet, Take 20 mg by mouth at bedtime., Disp: , Rfl:    amoxicillin (AMOXIL) 500 MG capsule, Take 500 mg by mouth 3 (three) times daily. Pt states only for dentist appointments. (Patient not taking: Reported on 10/03/2021), Disp: , Rfl:    levothyroxine (SYNTHROID) 100 MCG tablet, Take 100 mcg by mouth daily before breakfast., Disp: , Rfl:   Physical exam:  Vitals:   04/06/22 1333  BP: (!) 166/83  Pulse: 72  Temp: (!) 96.2 F (35.7 C)  TempSrc: Tympanic  Weight: 196 lb (88.9 kg)   Physical Exam Constitutional:      General: She is not in acute  distress. Cardiovascular:     Rate and Rhythm: Normal rate and regular rhythm.     Heart sounds: Normal heart sounds.  Pulmonary:     Effort: Pulmonary effort is normal.     Breath sounds: Normal breath sounds.  Abdominal:     General: Bowel sounds are normal.     Palpations: Abdomen is soft.  Skin:    General: Skin is warm and dry.  Neurological:     Mental Status: She is alert and oriented to person, place, and time.         Latest Ref Rng & Units 04/06/2022    1:21 PM  CMP  Glucose 70 - 99 mg/dL 98   BUN 8 - 23 mg/dL 19   Creatinine 0.44 - 1.00 mg/dL 0.95   Sodium 135 - 145 mmol/L 139   Potassium 3.5 - 5.1 mmol/L 4.3   Chloride 98 - 111 mmol/L 105   CO2 22 - 32 mmol/L 23   Calcium 8.9 - 10.3  mg/dL 9.0   Total Protein 6.5 - 8.1 g/dL 7.4   Total Bilirubin 0.3 - 1.2 mg/dL 0.5   Alkaline Phos 38 - 126 U/L 81   AST 15 - 41 U/L 26   ALT 0 - 44 U/L 33       Latest Ref Rng & Units 04/06/2022    1:21 PM  CBC  WBC 4.0 - 10.5 K/uL 4.6   Hemoglobin 12.0 - 15.0 g/dL 12.2   Hematocrit 36.0 - 46.0 % 35.6   Platelets 150 - 400 K/uL 337     No images are attached to the encounter.  CT CHEST ABDOMEN PELVIS W CONTRAST  Result Date: 04/04/2022 CLINICAL DATA:  Colon cancer diagnosed in May with resection. Asymptomatic. * Tracking Code: BO * EXAM: CT CHEST, ABDOMEN, AND PELVIS WITH CONTRAST TECHNIQUE: Multidetector CT imaging of the chest, abdomen and pelvis was performed following the standard protocol during bolus administration of intravenous contrast. RADIATION DOSE REDUCTION: This exam was performed according to the departmental dose-optimization program which includes automated exposure control, adjustment of the mA and/or kV according to patient size and/or use of iterative reconstruction technique. CONTRAST:  147m OMNIPAQUE IOHEXOL 300 MG/ML  SOLN COMPARISON:  08/25/2021 FINDINGS: CT CHEST FINDINGS Cardiovascular: Aortic atherosclerosis. Normal heart size, without pericardial  effusion. Three vessel coronary artery calcification. No central pulmonary embolism, on this non-dedicated study. Mediastinum/Nodes: No supraclavicular adenopathy. No mediastinal or hilar adenopathy. Tiny hiatal hernia. Lungs/Pleura: No pleural fluid. Mild centrilobular emphysema. 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 3 mm right middle lobe pulmonary nodule on 80/3 is similar. Musculoskeletal: No acute osseous abnormality. CT ABDOMEN PELVIS FINDINGS Hepatobiliary: Mild hepatic steatosis without focal liver lesion. Normal gallbladder, without biliary ductal dilatation. Pancreas: Normal, without mass or ductal dilatation. Spleen: Normal in size, without focal abnormality. Adrenals/Urinary Tract: Normal right adrenal gland. Minimal left adrenal nodularity is unchanged. Mild renal cortical thinning bilaterally. No hydronephrosis. Normal urinary bladder. Stomach/Bowel: Normal remainder of the stomach. Surgical sutures within the rectum on 106/2 without local recurrence. Large colonic stool burden within the descending colon. Normal terminal ileum and appendix. Normal small bowel. Vascular/Lymphatic: Aortic atherosclerosis. No abdominopelvic adenopathy. Reproductive: Normal uterus and adnexa. Other: No significant free fluid. Mild pelvic floor laxity. No evidence of omental or peritoneal disease. Musculoskeletal: Mild osteopenia.  Trace L4-5 anterolisthesis. IMPRESSION: 1. Surgical sutures within the rectum. No evidence of locally recurrent or metastatic disease. 2. A right middle lobe 3 mm pulmonary nodule is unchanged, favored to be benign/incidental. Recommend attention on follow-up. 3. Hepatic steatosis 4. Age advanced coronary artery atherosclerosis. Recommend assessment of coronary risk factors. 5. Aortic atherosclerosis (ICD10-I70.0) and emphysema (ICD10-J43.9).  Electronically Signed   By: KAbigail MiyamotoM.D.   On: 04/04/2022 14:57     Assessment and plan- Patient is a 66y.o. female with history of stage II aT3 N0 M0 colon adenocarcinoma s/p surgery.  She did not get any adjuvant chemotherapy.  She is here for routine follow-up.  Clinically patient is doing well with no concerning signs and symptoms of recurrence based on today's exam. CEA is within normal limits.  I have reviewed CT chest abdomen and pelvis with contrast images independently and discussed findings with the patient which does not show any evidence of recurrent or progressive disease.  I did recommend that she should discuss atherosclerosis findings with her primary care doctor.  There was a 3 mm  nonspecific right middle lobe lung nodule which I will continue to follow.  I will see her back in 6 months with labs and scans   Visit Diagnosis 1. Goals of care, counseling/discussion   2. Malignant neoplasm of sigmoid colon [facility])      Dr. Randa Evens, MD, MPH [facility] at [facility] [phone] 04/08/2022 7:55 AM

## 2022-06-14 ENCOUNTER — Ambulatory Visit
Admission: EM | Admit: 2022-06-14 | Discharge: 2022-06-14 | Disposition: A | Discharge status: Home / Self Care | Payer: BC Managed Care – PPO | Attending: Urgent Care | Admitting: Urgent Care

## 2022-06-14 DIAGNOSIS — R030 Elevated blood-pressure reading, without diagnosis of hypertension: Secondary | ICD-10-CM

## 2022-06-14 DIAGNOSIS — J069 Acute upper respiratory infection, unspecified: Secondary | ICD-10-CM | POA: Diagnosis not present

## 2022-06-14 MED ORDER — BENZONATATE 100 MG PO CAPS: ORAL_CAPSULE | ORAL | 0 refills | Status: AC

## 2022-06-14 NOTE — ED Provider Notes (Signed)
Alejandra Wilson    CSN: [id number] Arrival date & time: 06/14/22  1145      History   Chief Complaint Chief Complaint  Patient presents with   Chills   Cough   Nasal Congestion    HPI Alejandra Wilson is a 67 y.o. female.    Cough   Presents to urgent care with complaint of chills, cough, congestion starting 5 days ago.  She endorses some nasal congestion and congestion of her right ear as well.  She is most concerned about her cough which is productive.  Past Medical History:  Diagnosis Date   GERD (gastroesophageal reflux disease)    Hyperlipidemia    Hypothyroidism    Insomnia    Osteoarthritis of right knee    Rheumatoid arthritis involving multiple sites with positive rheumatoid factor [facility])     Patient Active Problem List   Diagnosis Date Noted   Colon cancer ([place]) 09/06/2021   Allergic rhinitis 08/23/2021   Hyperlipidemia 08/23/2021   Hypothyroidism 08/23/2021   Primary insomnia 08/21/2016    Past Surgical History:  Procedure Laterality Date   COLONOSCOPY WITH PROPOFOL N/A 08/21/2021   Procedure: COLONOSCOPY WITH PROPOFOL;  Surgeon: Lesly Rubenstein, MD;  Location: [facility] ENDOSCOPY;  Service: Endoscopy;  Laterality: N/A;   KNEE CLOSED REDUCTION Right 02/09/2021   Procedure: Right knee manipulation with steroid injection;  Surgeon: Corky Mull, MD;  Location: [facility] ORS;  Service: Orthopedics;  Laterality: Right;   OVARIAN CYST REMOVAL     PARTIAL KNEE ARTHROPLASTY Left 07/09/2019   Procedure: UNICOMPARTMENTAL KNEE;  Surgeon: Corky Mull, MD;  Location: [facility] ORS;  Service: Orthopedics;  Laterality: Left;   PARTIAL KNEE ARTHROPLASTY Right 12/20/2020   Procedure: RIGHT PARTIAL KNEE REPLACEMENT;  Surgeon: Corky Mull, MD;  Location: [facility] ORS;  Service: Orthopedics;  Laterality: Right;    OB History   No obstetric history on file.      Home Medications    Prior to Admission medications   Medication Sig Start Date End Date Taking?  Authorizing Provider  amoxicillin (AMOXIL) 500 MG capsule Take 500 mg by mouth 3 (three) times daily. Pt states only for dentist appointments. Patient not taking: Reported on 10/03/2021    [provider]  diclofenac (VOLTAREN) 50 MG EC tablet Take 50 mg by mouth daily.    [provider]  hydroxychloroquine (PLAQUENIL) 200 MG tablet Take 200 mg by mouth at bedtime. 06/20/19   [provider]  levothyroxine (SYNTHROID) 100 MCG tablet Take 100 mcg by mouth daily before breakfast. 03/22/21 03/22/22  [provider]  omeprazole (PRILOSEC) 20 MG capsule Take 20 mg by mouth daily. 03/22/21   [provider]  RINVOQ 15 MG TB24 Take 15 mg by mouth in the morning. 11/22/20   [provider]  simvastatin (ZOCOR) 20 MG tablet Take 20 mg by mouth at bedtime. 03/22/21   [provider]    Family History Family History  Problem Relation Age of Onset   Diabetes Mother    Heart disease Father    Breast cancer Neg Hx     Social History Social History   Tobacco Use   Smoking status: Former    Types: Cigarettes   Smokeless tobacco: Never  Vaping Use   Vaping Use: Some days   Substances: Nicotine  Substance Use Topics   Alcohol use: No   Drug use: Never     Allergies   Codeine   Review of Systems Review of  Systems  Respiratory:  Positive for cough.      Physical Exam Triage Vital Signs ED Triage Vitals [06/14/22 1319]  Enc Vitals Group     BP (!) 185/78     Pulse Rate 61     Resp 18     Temp 98 F (36.7 C)     Temp Source Oral     SpO2 98 %     Weight      Height      Head Circumference      Peak Flow      Pain Score 0     Pain Loc      Pain Edu?      Excl. in [place]?    No data found.  Updated Vital Signs BP (!) 185/78 (BP Location: Left Arm)   Pulse 61   Temp 98 F (36.7 C) (Oral)   Resp 18   SpO2 98%   Visual Acuity Right Eye Distance:   Left Eye Distance:   Bilateral Distance:    Right Eye Near:    Left Eye Near:    Bilateral Near:     Physical Exam Vitals reviewed.  Constitutional:      Appearance: Normal appearance. She is not ill-appearing.  HENT:     Right Ear: There is impacted cerumen.     Left Ear: There is impacted cerumen.  Cardiovascular:     Rate and Rhythm: Normal rate and regular rhythm.     Pulses: Normal pulses.     Heart sounds: Normal heart sounds.  Pulmonary:     Effort: Pulmonary effort is normal.     Breath sounds: Normal breath sounds.  Neurological:     Mental Status: She is alert.  Psychiatric:        Mood and Affect: Mood normal.        Behavior: Behavior normal.      UC Treatments / Results  Labs (all labs ordered are listed, but only abnormal results are displayed) Labs Reviewed - No data to display  EKG   Radiology No results found.  Procedures Procedures (including critical care time)  Medications Ordered in UC Medications - No data to display  Initial Impression / Assessment and Plan / UC Course  I have reviewed the triage vital signs and the nursing notes.  Pertinent labs & imaging results that were available during my care of the patient were reviewed by me and considered in my medical decision making (see chart for details).   Patient is afebrile here without recent antipyretics. Satting well on room air. Overall is well appearing, well hydrated, without respiratory distress. Pulmonary exam is unremarkable.  Lungs CTAB without wheezing, rhonchi, rales.  TMs are totally occluded by excessive cerumen in her EACs bilaterally.  Recommend patient use Debrox solution to soften and remove earwax.  May need irrigation and suggest that she discuss with her primary care provider at next opportunity.  Her symptoms are consistent with an acute viral process given duration of 5 days.  She is outside the window of treatment for influenza and so no testing was performed today.  She is also largely outside the window for COVID so, similarly,  no testing today.  Will prescribe benzonatate to help her with her cough since this is her most troublesome symptom.  Otherwise recommending continued use of OTC medication to control her symptoms.  Final Clinical Impressions(s) / UC Diagnoses   Final diagnoses:  None   Discharge Instructions   None  ED Prescriptions   None    PDMP not reviewed this encounter.   Rose Phi, [place] 06/14/22 1340

## 2022-06-14 NOTE — Discharge Instructions (Addendum)
You have been diagnosed with a viral upper respiratory infection based on your symptoms and exam. Viral illnesses cannot be treated with antibiotics - they are self limiting - and you should find your symptoms resolving within a few days. Get plenty of rest and non-caffeinated fluids. Watch for signs of dehydration including reduced urine output and dark colored urine.  We recommend you use over-the-counter medications for symptom control including acetaminophen (Tylenol), ibuprofen (Advil/Motrin) or naproxen (Aleve) for throat pain, fever, chills or body aches. You may combine use of acetaminophen and ibuprofen/naproxen if needed.  Some patients find an pain-relieving throat spray such as Chloraseptic to be effective.  Also recommend cold/cough medication containing a cough suppressant such as dextromethorphan, as needed. Please note that some cough medications are not recommended if you suffer from hypertension.    Saline mist spray is helpful for removing excess mucus from your nose.  Room humidifiers are helpful to ease breathing at night. I recommend guaifenesin (Mucinex) with plenty of water throughout the day to help thin and loosen mucus secretions in your respiratory passages.   If appropriate based upon your other medical problems, you might also find relief of nasal/sinus congestion symptoms by using a nasal decongestant such as fluticasone (Flonase ) or pseudoephedrine (Sudafed sinus).  You will need to obtain Sudafed from behind the pharmacist counter.  Speak to the pharmacist to verify that you are not duplicating medications with other over-the-counter formulations that you may be using.

## 2022-06-14 NOTE — ED Triage Notes (Signed)
Pt reports Saturday she began having chills, cough, and congestion.  Home interventions: dayquil, nyquil

## 2022-06-19 ENCOUNTER — Other Ambulatory Visit: Payer: Self-pay | Admitting: Physician Assistant

## 2022-06-19 DIAGNOSIS — Z1231 Encounter for screening mammogram for malignant neoplasm of breast: Secondary | ICD-10-CM

## 2022-07-06 ENCOUNTER — Ambulatory Visit
Admission: RE | Admit: 2022-07-06 | Discharge: 2022-07-06 | Disposition: A | Discharge status: Home / Self Care | Payer: BC Managed Care – PPO | Source: Ambulatory Visit | Attending: Physician Assistant | Admitting: Physician Assistant

## 2022-07-06 DIAGNOSIS — Z1231 Encounter for screening mammogram for malignant neoplasm of breast: Secondary | ICD-10-CM | POA: Insufficient documentation

## 2022-10-04 ENCOUNTER — Ambulatory Visit
Admission: RE | Admit: 2022-10-04 | Discharge: 2022-10-04 | Disposition: A | Discharge status: Home / Self Care | Payer: BC Managed Care – PPO | Source: Ambulatory Visit | Attending: Oncology | Admitting: Oncology

## 2022-10-04 DIAGNOSIS — C187 Malignant neoplasm of sigmoid colon: Secondary | ICD-10-CM | POA: Insufficient documentation

## 2022-10-04 MED ORDER — IOHEXOL 300 MG/ML  SOLN
100.0000 mL | Freq: Once | INTRAMUSCULAR | Status: AC | PRN
  Administered 2022-10-04: 100 mL via INTRAVENOUS

## 2022-10-04 MED ORDER — BARIUM SULFATE 2 % PO SUSP
450.0000 mL | ORAL | Status: AC
  Administered 2022-10-04 (×2): 450 mL via ORAL

## 2022-10-09 ENCOUNTER — Encounter: Payer: Self-pay | Admitting: Oncology

## 2022-10-09 ENCOUNTER — Inpatient Hospital Stay: Payer: BC Managed Care – PPO | Attending: Oncology

## 2022-10-09 ENCOUNTER — Inpatient Hospital Stay: Payer: BC Managed Care – PPO | Admitting: Oncology

## 2022-10-09 VITALS — BP 149/82 | HR 68 | Temp 97.6°F | Wt 194.1 lb

## 2022-10-09 DIAGNOSIS — C187 Malignant neoplasm of sigmoid colon: Secondary | ICD-10-CM

## 2022-10-09 DIAGNOSIS — Z87891 Personal history of nicotine dependence: Secondary | ICD-10-CM | POA: Insufficient documentation

## 2022-10-09 DIAGNOSIS — Z08 Encounter for follow-up examination after completed treatment for malignant neoplasm: Secondary | ICD-10-CM | POA: Diagnosis not present

## 2022-10-09 DIAGNOSIS — C189 Malignant neoplasm of colon, unspecified: Secondary | ICD-10-CM | POA: Insufficient documentation

## 2022-10-09 DIAGNOSIS — Z85038 Personal history of other malignant neoplasm of large intestine: Secondary | ICD-10-CM | POA: Diagnosis not present

## 2022-10-09 LAB — CBC WITH DIFFERENTIAL/PLATELET
Abs Immature Granulocytes: 0.02 10*3/uL (ref 0.00–0.07)
Basophils Absolute: 0 10*3/uL (ref 0.0–0.1)
Basophils Relative: 1 %
Eosinophils Absolute: 0.1 10*3/uL (ref 0.0–0.5)
Eosinophils Relative: 1 %
HCT: 38.7 % (ref 36.0–46.0)
Hemoglobin: 13 g/dL (ref 12.0–15.0)
Immature Granulocytes: 0 %
Lymphocytes Relative: 20 %
Lymphs Abs: 1 10*3/uL (ref 0.7–4.0)
MCH: 29.4 pg (ref 26.0–34.0)
MCHC: 33.6 g/dL (ref 30.0–36.0)
MCV: 87.6 fL (ref 80.0–100.0)
Monocytes Absolute: 0.7 10*3/uL (ref 0.1–1.0)
Monocytes Relative: 13 %
Neutro Abs: 3.2 10*3/uL (ref 1.7–7.7)
Neutrophils Relative %: 65 %
Platelets: 330 10*3/uL (ref 150–400)
RBC: 4.42 MIL/uL (ref 3.87–5.11)
RDW: 12.9 % (ref 11.5–15.5)
WBC: 5 10*3/uL (ref 4.0–10.5)
nRBC: 0 % (ref 0.0–0.2)

## 2022-10-09 LAB — COMPREHENSIVE METABOLIC PANEL
ALT: 40 U/L (ref 0–44)
AST: 30 U/L (ref 15–41)
Albumin: 4.7 g/dL (ref 3.5–5.0)
Alkaline Phosphatase: 83 U/L (ref 38–126)
Anion gap: 9 (ref 5–15)
BUN: 19 mg/dL (ref 8–23)
CO2: 24 mmol/L (ref 22–32)
Calcium: 9 mg/dL (ref 8.9–10.3)
Chloride: 103 mmol/L (ref 98–111)
Creatinine, Ser: 0.98 mg/dL (ref 0.44–1.00)
GFR, Estimated: >60 mL/min (ref >60)
Glucose, Bld: 107 mg/dL — ABNORMAL HIGH (ref 70–99)
Potassium: 4 mmol/L (ref 3.5–5.1)
Sodium: 136 mmol/L (ref 135–145)
Total Bilirubin: 1 mg/dL (ref 0.3–1.2)
Total Protein: 7.8 g/dL (ref 6.5–8.1)

## 2022-10-09 NOTE — Progress Notes (Signed)
Hematology/Oncology Consult note [facility]  Telephone:([phone] [phone]  Patient Care Team: Patrice Paradise, MD as PCP - General (Physician Assistant) Benita Gutter, RN as Oncology Nurse Navigator   Name of the patient: Alejandra Wilson  [id number]  1955-09-08   Date of visit: 10/09/22  Diagnosis-history of stage II colon cancer  Chief complaint/ Reason for visit-routine follow-up visit of colon cancer  Heme/Onc history:  Oncology History Overview Note  Rectal bleeding in November 2022.  Colonoscopy on 08/21/2021 showed 22 mm polypoid lesion in the rectosigmoid colon 18 cm from the anal verge.  Biopsy showed moderately differentiated adenocarcinoma.  CT chest abdomen and pelvis showed no evidence of metastatic disease.  Patient underwent rectosigmoid resection by Dr. Maia Plan on 09/06/2021 which showed moderately differentiated adenocarcinoma invading through muscularis propria into pericolonic tissue.  3 colonic lymph nodes negative for malignancy.  Margins negative.  Lymphovascular invasion not identified.  Perineural invasion not identified.  PT3 pN0.  MSI stable.     Colon cancer (HCC)  09/06/2021 Initial Diagnosis   Colon cancer (HCC)   10/04/2021 Cancer Staging   Staging form: Colon and Rectum, AJCC 8th Edition - Pathologic stage from 10/04/2021: Stage IIA (pT3, pN0, cM0) - Signed by Creig Hines, MD on 10/04/2021 Total positive nodes: 0 Histologic grading system: 4 grade system Histologic grade (G): G2 Residual tumor (R): R0 - None      Interval history-patient is doing well and denies any specific complaints at this time.  Appetite and weight have remained stable.  Denies any blood in her stool or urine.  Denies any changes in her bowel habits.  She has follow-up with [facility] in August 2023 for her surveillance colonoscopy.  ECOG PS- 0 Pain scale- 0   Review of systems- Review of Systems  Constitutional:  Negative for  chills, fever, malaise/fatigue and weight loss.  HENT:  Negative for congestion, ear discharge and nosebleeds.   Eyes:  Negative for blurred vision.  Respiratory:  Negative for cough, hemoptysis, sputum production, shortness of breath and wheezing.   Cardiovascular:  Negative for chest pain, palpitations, orthopnea and claudication.  Gastrointestinal:  Negative for abdominal pain, blood in stool, constipation, diarrhea, heartburn, melena, nausea and vomiting.  Genitourinary:  Negative for dysuria, flank pain, frequency, hematuria and urgency.  Musculoskeletal:  Negative for back pain, joint pain and myalgias.  Skin:  Negative for rash.  Neurological:  Negative for dizziness, tingling, focal weakness, seizures, weakness and headaches.  Endo/Heme/Allergies:  Does not bruise/bleed easily.  Psychiatric/Behavioral:  Negative for depression and suicidal ideas. The patient does not have insomnia.       Allergies  Allergen Reactions   Codeine Nausea Only     Past Medical History:  Diagnosis Date   GERD (gastroesophageal reflux disease)    Hyperlipidemia    Hypothyroidism    Insomnia    Osteoarthritis of right knee    Rheumatoid arthritis involving multiple sites with positive rheumatoid factor (HCC)      Past Surgical History:  Procedure Laterality Date   COLONOSCOPY WITH PROPOFOL N/A 08/21/2021   Procedure: COLONOSCOPY WITH PROPOFOL;  Surgeon: Regis Bill, MD;  Location: [facility] ENDOSCOPY;  Service: Endoscopy;  Laterality: N/A;   KNEE CLOSED REDUCTION Right 02/09/2021   Procedure: Right knee manipulation with steroid injection;  Surgeon: Christena Flake, MD;  Location: [facility] ORS;  Service: Orthopedics;  Laterality: Right;   OVARIAN CYST REMOVAL     PARTIAL KNEE ARTHROPLASTY Left  07/09/2019   Procedure: UNICOMPARTMENTAL KNEE;  Surgeon: Christena Flake, MD;  Location: [facility] ORS;  Service: Orthopedics;  Laterality: Left;   PARTIAL KNEE ARTHROPLASTY Right 12/20/2020   Procedure: RIGHT  PARTIAL KNEE REPLACEMENT;  Surgeon: Christena Flake, MD;  Location: [facility] ORS;  Service: Orthopedics;  Laterality: Right;    Social History   Socioeconomic History   Marital status: Married    Spouse name: Adela Lank   Number of children: 2   Years of education: Not on file   Highest education level: Not on file  Occupational History   Not on file  Tobacco Use   Smoking status: Former    Types: Cigarettes   Smokeless tobacco: Never  Vaping Use   Vaping Use: Some days   Substances: Nicotine  Substance and Sexual Activity   Alcohol use: No   Drug use: Never   Sexual activity: Not on file  Other Topics Concern   Not on file  Social History Narrative   Lives at home with husband and son.   Social Determinants of Health   Financial Resource Strain: Not on file  Food Insecurity: Not on file  Transportation Needs: Not on file  Physical Activity: Not on file  Stress: Not on file  Social Connections: Not on file  Intimate Partner Violence: Not on file    Family History  Problem Relation Age of Onset   Diabetes Mother    Heart disease Father    Breast cancer Neg Hx      Current Outpatient Medications:    amoxicillin (AMOXIL) 500 MG capsule, Take 500 mg by mouth 3 (three) times daily. Pt states only for dentist appointments., Disp: , Rfl:    benzonatate (TESSALON) 100 MG capsule, Take 1-2 tablets 3 times a day as needed for cough, Disp: 30 capsule, Rfl: 0   diclofenac (VOLTAREN) 50 MG EC tablet, Take 50 mg by mouth daily., Disp: , Rfl:    hydroxychloroquine (PLAQUENIL) 200 MG tablet, Take 200 mg by mouth at bedtime., Disp: , Rfl:    levothyroxine (SYNTHROID) 100 MCG tablet, Take 100 mcg by mouth daily before breakfast., Disp: , Rfl:    omeprazole (PRILOSEC) 20 MG capsule, Take 20 mg by mouth daily., Disp: , Rfl:    RINVOQ 15 MG TB24, Take 15 mg by mouth in the morning., Disp: , Rfl:    simvastatin (ZOCOR) 20 MG tablet, Take 20 mg by mouth at bedtime., Disp: , Rfl:   Physical  exam:  Vitals:   10/09/22 1025  BP: (!) 149/82  Pulse: 68  Temp: 97.6 F (36.4 C)  SpO2: 100%  Weight: 194 lb 1.6 oz (88 kg)   Physical Exam Cardiovascular:     Rate and Rhythm: Normal rate and regular rhythm.     Heart sounds: Normal heart sounds.  Pulmonary:     Effort: Pulmonary effort is normal.     Breath sounds: Normal breath sounds.  Abdominal:     General: Bowel sounds are normal.     Palpations: Abdomen is soft.  Skin:    General: Skin is warm and dry.  Neurological:     Mental Status: She is alert and oriented to person, place, and time.         Latest Ref Rng & Units 10/09/2022    9:50 AM  CMP  Glucose 70 - 99 mg/dL 161   BUN 8 - 23 mg/dL 19   Creatinine 0.96 - 1.00 mg/dL 0.45   Sodium 409 - 811  mmol/L 136   Potassium 3.5 - 5.1 mmol/L 4.0   Chloride 98 - 111 mmol/L 103   CO2 22 - 32 mmol/L 24   Calcium 8.9 - 10.3 mg/dL 9.0   Total Protein 6.5 - 8.1 g/dL 7.8   Total Bilirubin 0.3 - 1.2 mg/dL 1.0   Alkaline Phos 38 - 126 U/L 83   AST 15 - 41 U/L 30   ALT 0 - 44 U/L 40       Latest Ref Rng & Units 10/09/2022    9:50 AM  CBC  WBC 4.0 - 10.5 K/uL 5.0   Hemoglobin 12.0 - 15.0 g/dL 78.2   Hematocrit 95.6 - 46.0 % 38.7   Platelets 150 - 400 K/uL 330     No images are attached to the encounter.  CT CHEST ABDOMEN PELVIS W CONTRAST  Addendum Date: 10/05/2022   ADDENDUM REPORT: 10/05/2022 07:21 ADDENDUM: An unchanged subcentimeter left adrenal nodule described in the body of the report was inadvertently omitted from the report impression. This is almost certainly a benign, incidental adrenal adenoma requiring no specific further characterization. Attention on restaging follow-up. Electronically Signed   By: Jearld Lesch M.D.   On: 10/05/2022 07:21   Result Date: 10/05/2022 CLINICAL DATA:  Colon cancer restaging, status post resection 08/2021 * Tracking Code: BO * EXAM: CT CHEST, ABDOMEN, AND PELVIS WITH CONTRAST TECHNIQUE: Multidetector CT imaging of the  chest, abdomen and pelvis was performed following the standard protocol during bolus administration of intravenous contrast. RADIATION DOSE REDUCTION: This exam was performed according to the departmental dose-optimization program which includes automated exposure control, adjustment of the mA and/or kV according to patient size and/or use of iterative reconstruction technique. CONTRAST:  OMNIPAQUE IOHEXOL 300 MG/ML SOLN additional oral enteric contrast COMPARISON:  04/04/2022 FINDINGS: CT CHEST FINDINGS Cardiovascular: Scattered aortic atherosclerosis. Normal heart size. Left and right coronary artery calcifications. No pericardial effusion. Mediastinum/Nodes: No enlarged mediastinal, hilar, or axillary lymph nodes. Small hiatal hernia. Thyroid gland, trachea, and esophagus demonstrate no significant findings. Lungs/Pleura: Scattered irregular bandlike scarring and ground-glass throughout the lungs, unchanged benign sequelae of prior infection or inflammation, suggesting prior COVID airspace disease. Unchanged 0.3 cm nodule of the anterior right middle lobe (series 3, image 82). No pleural effusion or pneumothorax. Musculoskeletal: No chest wall abnormality. No acute osseous findings. CT ABDOMEN PELVIS FINDINGS Hepatobiliary: No solid liver abnormality is seen. Hepatic steatosis. No gallstones, gallbladder wall thickening, or biliary dilatation. Pancreas: Unremarkable. No pancreatic ductal dilatation or surrounding inflammatory changes. Spleen: Normal in size without significant abnormality. Adrenals/Urinary Tract: Unchanged subcentimeter left adrenal nodule (series 2, image 56). Normal right adrenal gland. Kidneys are normal, without renal calculi, solid lesion, or hydronephrosis. Bladder is unremarkable. Stomach/Bowel: Stomach is within normal limits. Appendix appears normal. Status post low anterior resection and reanastomosis. No evidence of bowel wall thickening, distention, or inflammatory changes.  Moderate burden of stool throughout the colon and rectum. Vascular/Lymphatic: Aortic atherosclerosis. No enlarged abdominal or pelvic lymph nodes. Reproductive: No mass or other abnormality. Other: No abdominal wall hernia or abnormality. No ascites. Musculoskeletal: No acute osseous findings. IMPRESSION: 1. Status post low anterior resection and reanastomosis. 2. No evidence of recurrent or metastatic disease in the chest, abdomen, or pelvis. 3. Unchanged 0.3 cm pulmonary nodule of the right middle lobe, almost certainly benign and incidental sequelae of prior infection or inflammation. No new nodules. Attention on follow-up. 4. Hepatic steatosis. 5. Coronary artery disease. Aortic Atherosclerosis (ICD10-I70.0). Electronically Signed: By: Bonna Gains.D.  On: 10/05/2022 07:15     Assessment and plan- Patient is a 67 y.o. female  with history of stage II aT3 N0 M0 colon adenocarcinoma s/p surgery.  She is here for routine follow-up  CEA from today is pending.  Clinically she is doing well with no concerning signs and symptoms of recurrence based on today's exam.  Given her significant co-pay associated with CT scan she would like to get better on a yearly basis.  Will therefore plan to repeat CT in 1 years time.  I have reviewed her present CT chest abdomen pelvis images independently and discussed findings with the patient which does not show any evidence of recurrent or progressive disease.  I will see her back in 6 months with CBC with differential CMP and CEA   Visit Diagnosis 1. Encounter for follow-up surveillance of colon cancer      Dr. Owens Shark, MD, MPH [facility] at [facility] [phone] 10/09/2022 4:15 PM               Thank you

## 2022-10-10 LAB — CEA: CEA: 1.1 ng/mL (ref 0.0–4.7)

## 2022-10-12 ENCOUNTER — Ambulatory Visit: Payer: BC Managed Care – PPO | Admitting: Oncology

## 2022-10-12 ENCOUNTER — Other Ambulatory Visit: Payer: BC Managed Care – PPO

## 2022-11-22 ENCOUNTER — Other Ambulatory Visit: Payer: Self-pay

## 2022-11-22 ENCOUNTER — Other Ambulatory Visit (HOSPITAL_COMMUNITY): Payer: Self-pay

## 2022-11-22 MED ORDER — SIMVASTATIN 20 MG PO TABS
20.0000 mg | ORAL_TABLET | Freq: Every day | ORAL | 0 refills | Status: DC
  Filled 2022-11-22 – 2022-11-24 (×2): qty 90, 90d supply, fill #0

## 2022-11-22 MED ORDER — LEVOTHYROXINE SODIUM 88 MCG PO TABS
88.0000 ug | ORAL_TABLET | Freq: Every day | ORAL | 0 refills | Status: DC
  Filled 2022-11-22 – 2022-11-24 (×2): qty 90, 90d supply, fill #0

## 2022-11-24 ENCOUNTER — Other Ambulatory Visit (HOSPITAL_COMMUNITY): Payer: Self-pay

## 2022-11-26 ENCOUNTER — Other Ambulatory Visit (HOSPITAL_COMMUNITY): Payer: Self-pay

## 2022-11-27 ENCOUNTER — Other Ambulatory Visit (HOSPITAL_COMMUNITY): Payer: Self-pay

## 2022-11-27 ENCOUNTER — Encounter: Payer: Self-pay | Admitting: Pharmacist

## 2022-11-27 ENCOUNTER — Other Ambulatory Visit (HOSPITAL_BASED_OUTPATIENT_CLINIC_OR_DEPARTMENT_OTHER): Payer: Self-pay

## 2022-11-27 ENCOUNTER — Other Ambulatory Visit: Payer: Self-pay

## 2022-11-27 MED ORDER — OMEPRAZOLE 20 MG PO CPDR
20.0000 mg | DELAYED_RELEASE_CAPSULE | Freq: Every day | ORAL | 1 refills | Status: DC
  Filled 2022-11-27: qty 90, 90d supply, fill #0
  Filled 2023-02-23: qty 90, 90d supply, fill #1

## 2022-11-27 MED ORDER — HYDROXYCHLOROQUINE SULFATE 200 MG PO TABS
200.0000 mg | ORAL_TABLET | Freq: Every day | ORAL | 1 refills | Status: DC
  Filled 2022-11-27: qty 90, 90d supply, fill #0
  Filled 2023-02-23: qty 90, 90d supply, fill #1

## 2022-11-27 MED ORDER — DICLOFENAC SODIUM 50 MG PO TBEC
50.0000 mg | DELAYED_RELEASE_TABLET | Freq: Two times a day (BID) | ORAL | 1 refills | Status: DC
  Filled 2022-11-27: qty 180, 90d supply, fill #0
  Filled 2023-02-23: qty 180, 90d supply, fill #1

## 2022-11-30 ENCOUNTER — Other Ambulatory Visit: Payer: Self-pay

## 2022-12-07 DIAGNOSIS — E039 Hypothyroidism, unspecified: Secondary | ICD-10-CM | POA: Diagnosis not present

## 2022-12-27 DIAGNOSIS — I10 Essential (primary) hypertension: Secondary | ICD-10-CM | POA: Diagnosis not present

## 2023-01-08 DIAGNOSIS — Z79899 Other long term (current) drug therapy: Secondary | ICD-10-CM | POA: Diagnosis not present

## 2023-01-08 DIAGNOSIS — M0579 Rheumatoid arthritis with rheumatoid factor of multiple sites without organ or systems involvement: Secondary | ICD-10-CM | POA: Diagnosis not present

## 2023-01-22 ENCOUNTER — Ambulatory Visit: Payer: PPO

## 2023-01-22 DIAGNOSIS — Z85038 Personal history of other malignant neoplasm of large intestine: Secondary | ICD-10-CM | POA: Diagnosis not present

## 2023-01-22 DIAGNOSIS — Z08 Encounter for follow-up examination after completed treatment for malignant neoplasm: Secondary | ICD-10-CM | POA: Diagnosis not present

## 2023-01-22 DIAGNOSIS — K641 Second degree hemorrhoids: Secondary | ICD-10-CM | POA: Diagnosis not present

## 2023-01-22 DIAGNOSIS — Z980 Intestinal bypass and anastomosis status: Secondary | ICD-10-CM | POA: Diagnosis not present

## 2023-01-24 DIAGNOSIS — I10 Essential (primary) hypertension: Secondary | ICD-10-CM | POA: Diagnosis not present

## 2023-02-01 DIAGNOSIS — R079 Chest pain, unspecified: Secondary | ICD-10-CM | POA: Diagnosis not present

## 2023-02-01 DIAGNOSIS — J984 Other disorders of lung: Secondary | ICD-10-CM | POA: Diagnosis not present

## 2023-02-01 DIAGNOSIS — R0789 Other chest pain: Secondary | ICD-10-CM | POA: Diagnosis not present

## 2023-02-12 DIAGNOSIS — M069 Rheumatoid arthritis, unspecified: Secondary | ICD-10-CM | POA: Diagnosis not present

## 2023-02-12 DIAGNOSIS — Z79899 Other long term (current) drug therapy: Secondary | ICD-10-CM | POA: Diagnosis not present

## 2023-02-17 ENCOUNTER — Other Ambulatory Visit (HOSPITAL_COMMUNITY): Payer: Self-pay

## 2023-02-19 ENCOUNTER — Other Ambulatory Visit (HOSPITAL_COMMUNITY): Payer: Self-pay

## 2023-02-19 ENCOUNTER — Other Ambulatory Visit: Payer: Self-pay

## 2023-02-19 MED ORDER — LEVOTHYROXINE SODIUM 88 MCG PO TABS
88.0000 ug | ORAL_TABLET | Freq: Every day | ORAL | 1 refills | Status: DC
  Filled 2023-02-19: qty 90, 90d supply, fill #0
  Filled 2023-05-22: qty 90, 90d supply, fill #1

## 2023-02-19 MED ORDER — SIMVASTATIN 20 MG PO TABS
20.0000 mg | ORAL_TABLET | Freq: Every day | ORAL | 1 refills | Status: DC
  Filled 2023-02-19: qty 90, 90d supply, fill #0
  Filled 2023-05-22: qty 90, 90d supply, fill #1

## 2023-02-23 ENCOUNTER — Other Ambulatory Visit (HOSPITAL_COMMUNITY): Payer: Self-pay

## 2023-02-26 ENCOUNTER — Other Ambulatory Visit (HOSPITAL_COMMUNITY): Payer: Self-pay

## 2023-02-27 ENCOUNTER — Other Ambulatory Visit: Payer: Self-pay

## 2023-02-27 ENCOUNTER — Other Ambulatory Visit (HOSPITAL_COMMUNITY): Payer: Self-pay

## 2023-02-28 ENCOUNTER — Other Ambulatory Visit: Payer: Self-pay

## 2023-02-28 ENCOUNTER — Encounter: Payer: Self-pay | Admitting: Pharmacist

## 2023-02-28 ENCOUNTER — Other Ambulatory Visit (HOSPITAL_COMMUNITY): Payer: Self-pay

## 2023-03-25 DIAGNOSIS — M059 Rheumatoid arthritis with rheumatoid factor, unspecified: Secondary | ICD-10-CM | POA: Diagnosis not present

## 2023-03-25 DIAGNOSIS — E782 Mixed hyperlipidemia: Secondary | ICD-10-CM | POA: Diagnosis not present

## 2023-03-25 DIAGNOSIS — Z0000 Encounter for general adult medical examination without abnormal findings: Secondary | ICD-10-CM | POA: Diagnosis not present

## 2023-03-25 DIAGNOSIS — E039 Hypothyroidism, unspecified: Secondary | ICD-10-CM | POA: Diagnosis not present

## 2023-03-25 DIAGNOSIS — R7303 Prediabetes: Secondary | ICD-10-CM | POA: Diagnosis not present

## 2023-04-01 ENCOUNTER — Other Ambulatory Visit: Payer: Self-pay | Admitting: Physician Assistant

## 2023-04-01 DIAGNOSIS — Z1231 Encounter for screening mammogram for malignant neoplasm of breast: Secondary | ICD-10-CM

## 2023-04-01 DIAGNOSIS — C189 Malignant neoplasm of colon, unspecified: Secondary | ICD-10-CM | POA: Diagnosis not present

## 2023-04-01 DIAGNOSIS — R7303 Prediabetes: Secondary | ICD-10-CM | POA: Diagnosis not present

## 2023-04-01 DIAGNOSIS — E039 Hypothyroidism, unspecified: Secondary | ICD-10-CM | POA: Diagnosis not present

## 2023-04-01 DIAGNOSIS — Z0000 Encounter for general adult medical examination without abnormal findings: Secondary | ICD-10-CM | POA: Diagnosis not present

## 2023-04-01 DIAGNOSIS — I10 Essential (primary) hypertension: Secondary | ICD-10-CM | POA: Diagnosis not present

## 2023-04-01 DIAGNOSIS — M1711 Unilateral primary osteoarthritis, right knee: Secondary | ICD-10-CM | POA: Diagnosis not present

## 2023-04-01 DIAGNOSIS — F5101 Primary insomnia: Secondary | ICD-10-CM | POA: Diagnosis not present

## 2023-04-01 DIAGNOSIS — E782 Mixed hyperlipidemia: Secondary | ICD-10-CM | POA: Diagnosis not present

## 2023-04-01 DIAGNOSIS — M059 Rheumatoid arthritis with rheumatoid factor, unspecified: Secondary | ICD-10-CM | POA: Diagnosis not present

## 2023-04-10 ENCOUNTER — Ambulatory Visit: Payer: BC Managed Care – PPO | Admitting: Oncology

## 2023-04-10 ENCOUNTER — Other Ambulatory Visit: Payer: BC Managed Care – PPO

## 2023-05-13 ENCOUNTER — Other Ambulatory Visit (HOSPITAL_COMMUNITY): Payer: Self-pay

## 2023-05-13 ENCOUNTER — Other Ambulatory Visit: Payer: Self-pay

## 2023-05-13 DIAGNOSIS — Z79899 Other long term (current) drug therapy: Secondary | ICD-10-CM | POA: Diagnosis not present

## 2023-05-13 DIAGNOSIS — M810 Age-related osteoporosis without current pathological fracture: Secondary | ICD-10-CM | POA: Diagnosis not present

## 2023-05-13 DIAGNOSIS — M0579 Rheumatoid arthritis with rheumatoid factor of multiple sites without organ or systems involvement: Secondary | ICD-10-CM | POA: Diagnosis not present

## 2023-05-13 MED ORDER — OMEPRAZOLE 20 MG PO CPDR
20.0000 mg | DELAYED_RELEASE_CAPSULE | Freq: Every day | ORAL | 1 refills | Status: DC
  Filled 2023-05-13: qty 90, 90d supply, fill #0
  Filled 2023-08-27: qty 90, 90d supply, fill #1

## 2023-05-13 MED ORDER — DICLOFENAC SODIUM 50 MG PO TBEC
50.0000 mg | DELAYED_RELEASE_TABLET | Freq: Two times a day (BID) | ORAL | 1 refills | Status: DC
  Filled 2023-05-13: qty 180, 90d supply, fill #0
  Filled 2023-08-07: qty 180, 90d supply, fill #1

## 2023-05-13 MED ORDER — HYDROXYCHLOROQUINE SULFATE 200 MG PO TABS
200.0000 mg | ORAL_TABLET | Freq: Every day | ORAL | 1 refills | Status: DC
  Filled 2023-05-13: qty 90, 90d supply, fill #0
  Filled 2023-09-13: qty 90, 90d supply, fill #1

## 2023-05-14 ENCOUNTER — Other Ambulatory Visit: Payer: Self-pay

## 2023-05-22 ENCOUNTER — Other Ambulatory Visit (HOSPITAL_COMMUNITY): Payer: Self-pay

## 2023-07-08 ENCOUNTER — Ambulatory Visit
Admission: RE | Admit: 2023-07-08 | Discharge: 2023-07-08 | Disposition: A | Discharge status: Home / Self Care | Payer: Self-pay | Source: Ambulatory Visit | Attending: Physician Assistant | Admitting: Physician Assistant

## 2023-07-08 DIAGNOSIS — Z1231 Encounter for screening mammogram for malignant neoplasm of breast: Secondary | ICD-10-CM | POA: Insufficient documentation

## 2023-07-12 ENCOUNTER — Other Ambulatory Visit: Payer: Self-pay | Admitting: Physician Assistant

## 2023-07-12 DIAGNOSIS — R928 Other abnormal and inconclusive findings on diagnostic imaging of breast: Secondary | ICD-10-CM

## 2023-07-17 ENCOUNTER — Ambulatory Visit
Admission: RE | Admit: 2023-07-17 | Discharge: 2023-07-17 | Disposition: A | Discharge status: Home / Self Care | Source: Ambulatory Visit | Attending: Physician Assistant | Admitting: Physician Assistant

## 2023-07-17 DIAGNOSIS — R92322 Mammographic fibroglandular density, left breast: Secondary | ICD-10-CM | POA: Diagnosis not present

## 2023-07-17 DIAGNOSIS — N6320 Unspecified lump in the left breast, unspecified quadrant: Secondary | ICD-10-CM | POA: Diagnosis not present

## 2023-07-17 DIAGNOSIS — R928 Other abnormal and inconclusive findings on diagnostic imaging of breast: Secondary | ICD-10-CM | POA: Diagnosis not present

## 2023-07-23 ENCOUNTER — Other Ambulatory Visit: Payer: Self-pay

## 2023-07-23 ENCOUNTER — Other Ambulatory Visit (HOSPITAL_COMMUNITY): Payer: Self-pay

## 2023-07-23 MED ORDER — LOSARTAN POTASSIUM 100 MG PO TABS
100.0000 mg | ORAL_TABLET | Freq: Every day | ORAL | 1 refills | Status: DC
  Filled 2023-07-23: qty 90, 90d supply, fill #0
  Filled 2023-10-27: qty 90, 90d supply, fill #1

## 2023-08-02 DIAGNOSIS — M0579 Rheumatoid arthritis with rheumatoid factor of multiple sites without organ or systems involvement: Secondary | ICD-10-CM | POA: Diagnosis not present

## 2023-08-02 DIAGNOSIS — Z79899 Other long term (current) drug therapy: Secondary | ICD-10-CM | POA: Diagnosis not present

## 2023-08-07 ENCOUNTER — Other Ambulatory Visit (HOSPITAL_COMMUNITY): Payer: Self-pay

## 2023-08-07 DIAGNOSIS — Z79899 Other long term (current) drug therapy: Secondary | ICD-10-CM | POA: Diagnosis not present

## 2023-08-07 DIAGNOSIS — M069 Rheumatoid arthritis, unspecified: Secondary | ICD-10-CM | POA: Diagnosis not present

## 2023-08-16 ENCOUNTER — Other Ambulatory Visit (HOSPITAL_COMMUNITY): Payer: Self-pay

## 2023-08-17 ENCOUNTER — Other Ambulatory Visit (HOSPITAL_COMMUNITY): Payer: Self-pay

## 2023-08-17 MED ORDER — SIMVASTATIN 20 MG PO TABS
20.0000 mg | ORAL_TABLET | Freq: Every day | ORAL | 1 refills | Status: DC
  Filled 2023-08-17: qty 90, 90d supply, fill #0
  Filled 2023-11-13: qty 90, 90d supply, fill #1

## 2023-08-17 MED ORDER — LEVOTHYROXINE SODIUM 88 MCG PO TABS
88.0000 ug | ORAL_TABLET | Freq: Every day | ORAL | 1 refills | Status: DC
  Filled 2023-08-17: qty 30, 30d supply, fill #0
  Filled 2023-09-14: qty 30, 30d supply, fill #1
  Filled 2023-10-14: qty 30, 30d supply, fill #2
  Filled 2023-11-13: qty 30, 30d supply, fill #3
  Filled 2023-12-09: qty 30, 30d supply, fill #4
  Filled 2024-01-07: qty 30, 30d supply, fill #5

## 2023-08-27 ENCOUNTER — Other Ambulatory Visit (HOSPITAL_COMMUNITY): Payer: Self-pay

## 2023-09-07 IMAGING — MR MR PELVIS W/O CM
8 series · 48 of 48 positions shown · non-contrast
Comparison: None Available.

CLINICAL DATA: Recently diagnosed rectosigmoid carcinoma.

EXAM:
MRI PELVIS WITHOUT CONTRAST
TECHNIQUE: Multiplanar multisequence MR imaging of the pelvis was performed. No
intravenous contrast was administered. Ultrasound gel was
administered per rectum to optimize tumor evaluation.

[Series 2: T2 · sagittal · 2.5mm · 0.81mm/px · 5 of 35 slices shown (1 of 5)]
[im 1/35]
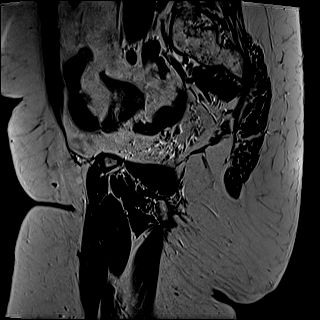
[im 9/35]
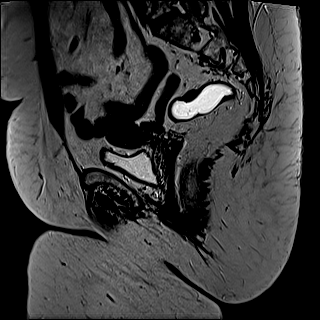
[im 18/35]
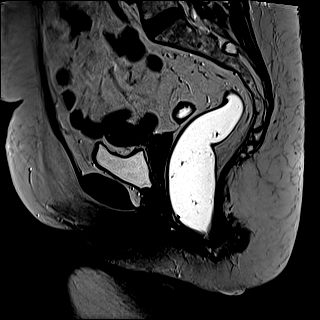
[im 26/35]
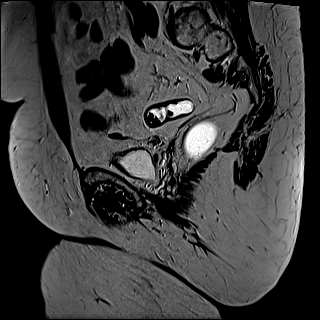
[im 35/35]
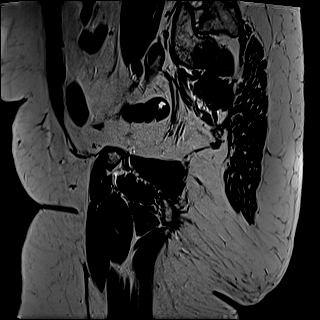

[Series 3: T2 · coronal · 3.0mm · 0.56mm/px · 7 of 45 slices shown (2 of 5)]
[im 1/45]
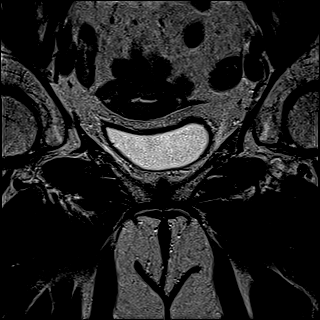
[im 8/45]
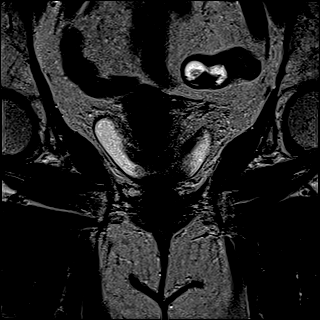
[im 15/45]
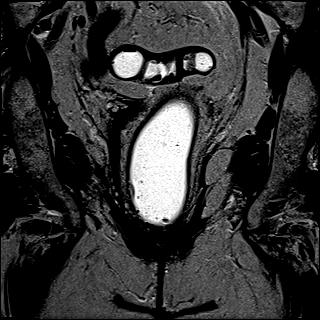
[im 23/45]
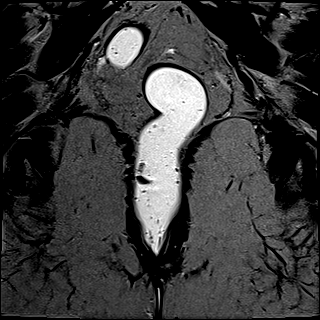
[im 30/45]
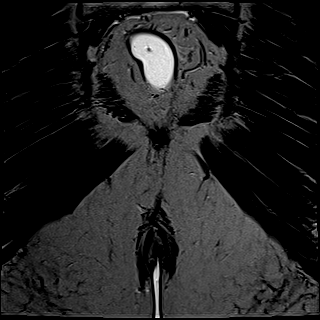
[im 37/45]
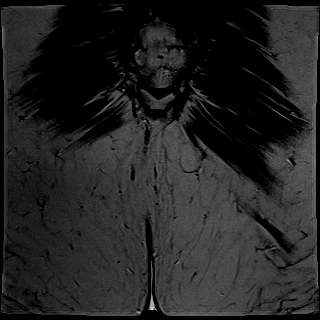
[im 45/45]
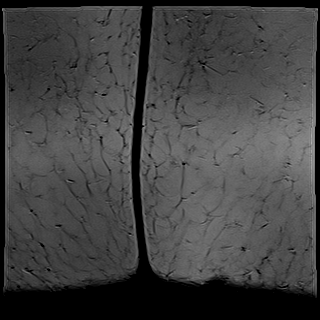

[Series 4: T2 · axial · 5.0mm · 1.19mm/px · z∈[-75,+135]mm · 5 of 36 slices shown (3 of 5)]
[im 1/36]
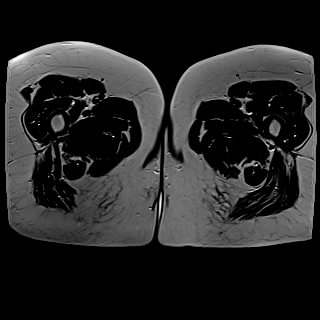
[im 9/36]
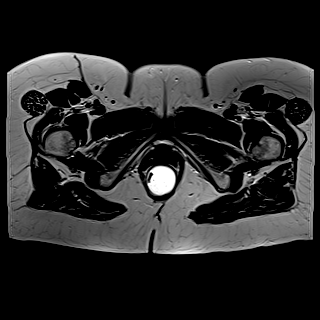
[im 18/36]
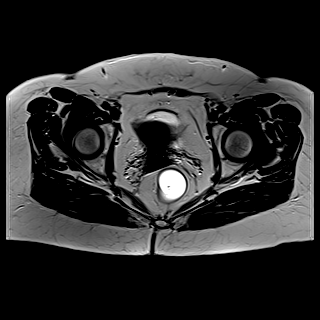
[im 27/36]
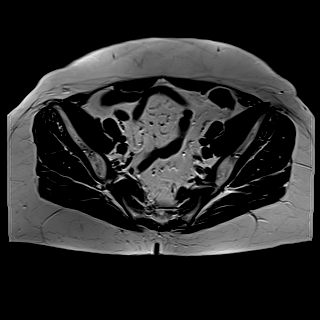
[im 36/36]
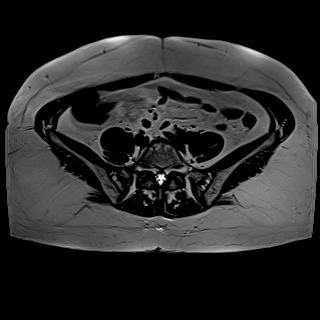

[Series 5: ax dwi_tracew · axial · 3.0mm · 1.48mm/px · z∈[+8,+80]mm · 11 of 75 slices shown]
[im 1/75]
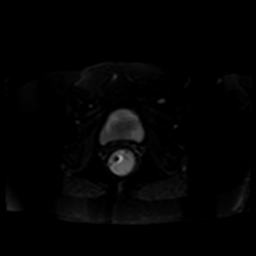
[im 8/75]
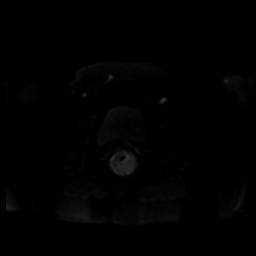
[im 15/75]
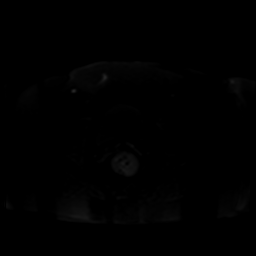
[im 23/75]
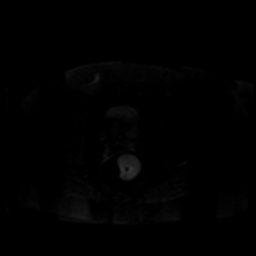
[im 30/75]
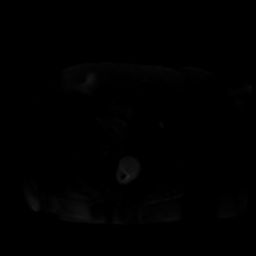
[im 38/75]
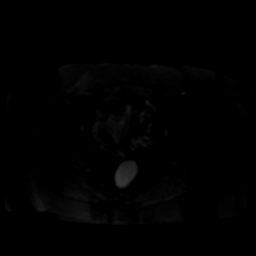
[im 45/75]
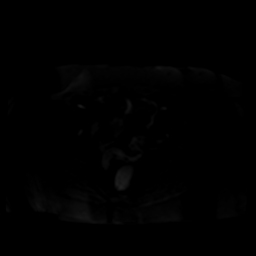
[im 52/75]
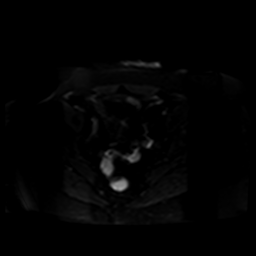
[im 60/75]
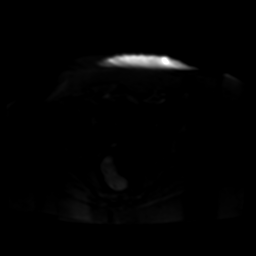
[im 67/75]
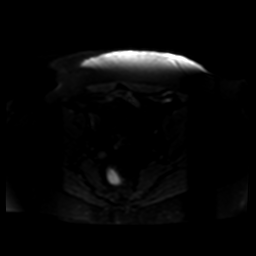
[im 75/75]
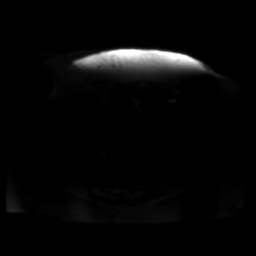

[Series 6: ax dwi_adc · axial · 3.0mm · 1.48mm/px · z∈[+8,+80]mm · 4 of 25 slices shown]
[im 1/25]
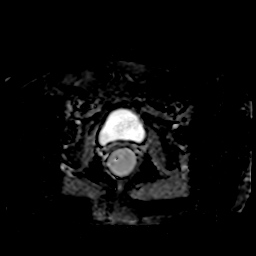
[im 9/25]
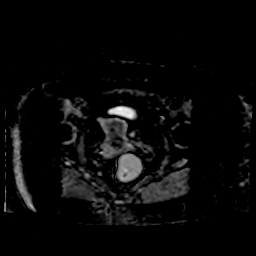
[im 17/25]
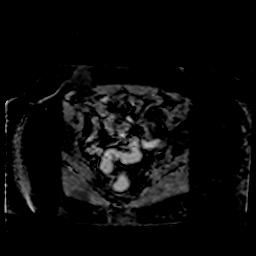
[im 25/25]
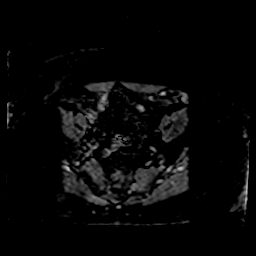

[Series 7: ax dwi_calc_bval · axial · 3.0mm · 1.48mm/px · z∈[+8,+80]mm · 4 of 24 slices shown]
[im 1/24]
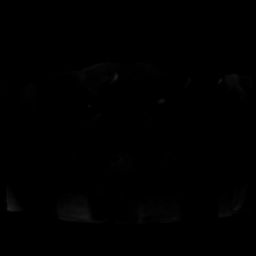
[im 8/24]
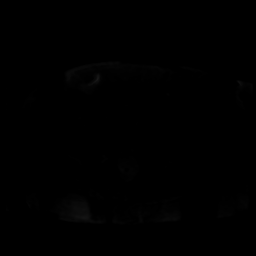
[im 16/24]
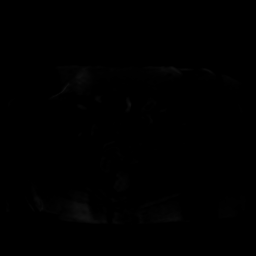
[im 24/24]
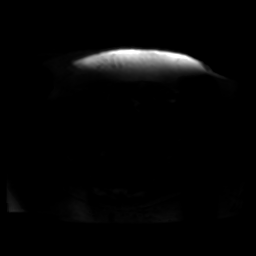

[Series 8: T2 · axial · 3.0mm · 0.56mm/px · z∈[-33,+75]mm · 6 of 39 slices shown (4 of 5)]
[im 1/39]
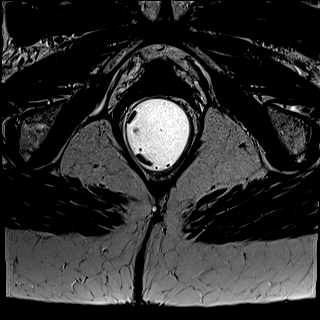
[im 8/39]
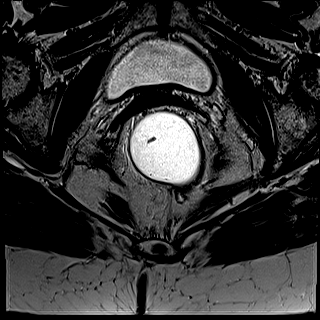
[im 16/39]
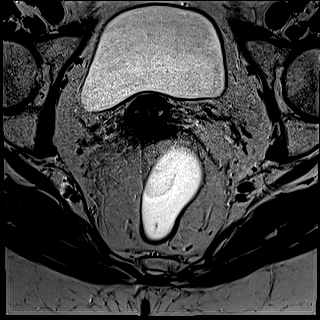
[im 23/39]
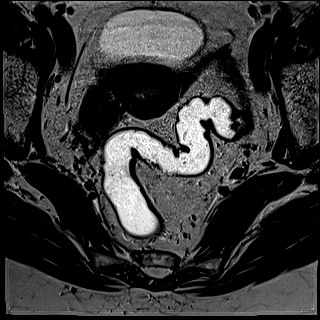
[im 31/39]
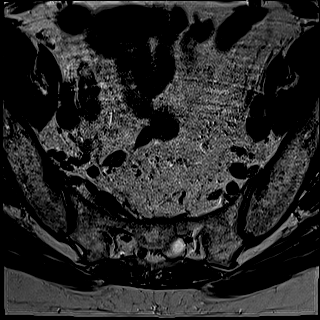
[im 39/39]
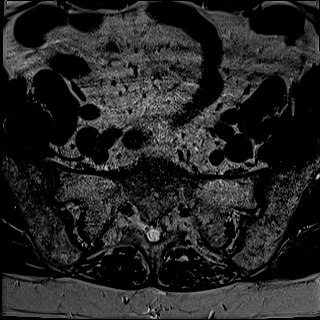

[Series 9: T2 · coronal · 3.0mm · 0.56mm/px · 6 of 40 slices shown (5 of 5)]
[im 1/40]
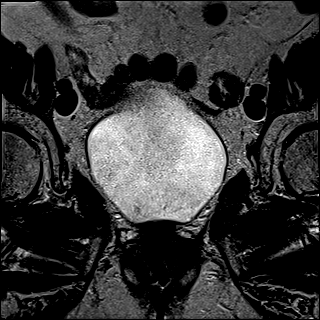
[im 8/40]
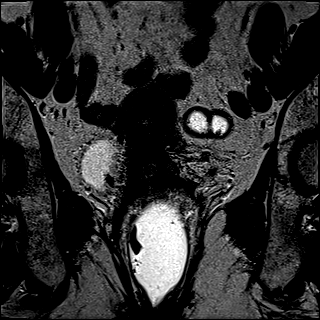
[im 16/40]
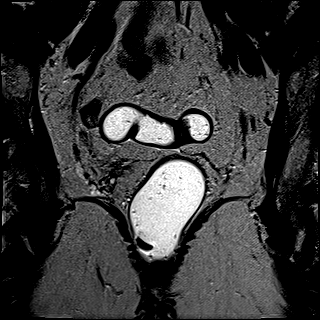
[im 24/40]
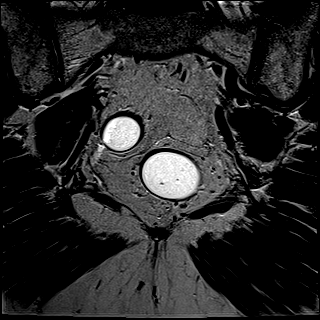
[im 32/40]
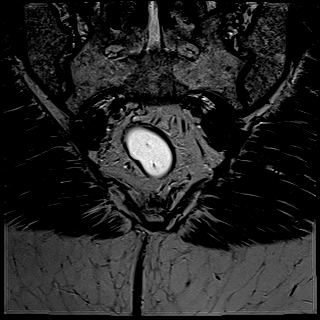
[im 40/40]
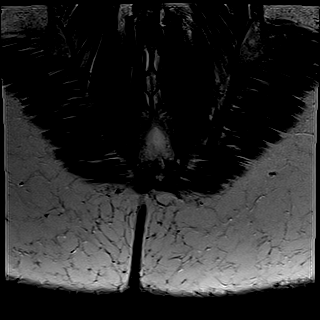

[48 of 48 positions shown; findings below may reference images not displayed]

FINDINGS: TUMOR LOCATION

A focal, sessile mural density is seen near the rectosigmoid
junction, which likely represents the mass seen on recent
colonoscopy. No other masses are visualized in the rectum on this
study. Assuming this is the site of the primary carcinoma, the
following features are noted:

Tumor distance from Anal Verge/Skin Surface:  17.9 cm

Tumor distance to Internal Anal Sphincter: 15.6 cm

TUMOR DESCRIPTION

Circumferential Extent: 20% along the posterior wall from the 4-8
o'clock positions.

Tumor Length: 1.7 cm

T - CATEGORY

Extension through Muscularis Propria: No= T1/T2

Shortest Distance of any tumor/node from Mesorectal Fascia: 2 mm

Extramural Vascular Invasion/Tumor Thrombus: No

Invasion of Anterior Peritoneal Reflection: No

Involvement of Adjacent Organs or Pelvic Sidewall: No

Levator Ani Involvement: No

N - CATEGORY

Mesorectal Lymph Nodes >=5mm: None=N0

Extra-mesorectal Lymphadenopathy: No

Other:  None.
IMPRESSION: Focal sessile mural density near the rectosigmoid junction.

Assuming this is the site of the primary carcinoma, local stage by
imaging is: T1/2, N0.

## 2023-09-14 ENCOUNTER — Other Ambulatory Visit (HOSPITAL_COMMUNITY): Payer: Self-pay

## 2023-09-23 DIAGNOSIS — E782 Mixed hyperlipidemia: Secondary | ICD-10-CM | POA: Diagnosis not present

## 2023-09-23 DIAGNOSIS — I10 Essential (primary) hypertension: Secondary | ICD-10-CM | POA: Diagnosis not present

## 2023-09-23 DIAGNOSIS — R7303 Prediabetes: Secondary | ICD-10-CM | POA: Diagnosis not present

## 2023-10-07 ENCOUNTER — Other Ambulatory Visit (HOSPITAL_COMMUNITY): Payer: Self-pay

## 2023-10-07 ENCOUNTER — Other Ambulatory Visit: Payer: Self-pay

## 2023-10-07 DIAGNOSIS — M1711 Unilateral primary osteoarthritis, right knee: Secondary | ICD-10-CM | POA: Diagnosis not present

## 2023-10-07 DIAGNOSIS — Z0000 Encounter for general adult medical examination without abnormal findings: Secondary | ICD-10-CM | POA: Diagnosis not present

## 2023-10-07 DIAGNOSIS — E039 Hypothyroidism, unspecified: Secondary | ICD-10-CM | POA: Diagnosis not present

## 2023-10-07 DIAGNOSIS — I10 Essential (primary) hypertension: Secondary | ICD-10-CM | POA: Diagnosis not present

## 2023-10-07 DIAGNOSIS — C189 Malignant neoplasm of colon, unspecified: Secondary | ICD-10-CM | POA: Diagnosis not present

## 2023-10-07 DIAGNOSIS — R7303 Prediabetes: Secondary | ICD-10-CM | POA: Diagnosis not present

## 2023-10-07 DIAGNOSIS — M059 Rheumatoid arthritis with rheumatoid factor, unspecified: Secondary | ICD-10-CM | POA: Diagnosis not present

## 2023-10-07 DIAGNOSIS — E782 Mixed hyperlipidemia: Secondary | ICD-10-CM | POA: Diagnosis not present

## 2023-10-07 MED ORDER — HYDROCHLOROTHIAZIDE 25 MG PO TABS
25.0000 mg | ORAL_TABLET | Freq: Every day | ORAL | 5 refills | Status: DC
  Filled 2023-10-07: qty 30, 30d supply, fill #0
  Filled 2023-11-13: qty 30, 30d supply, fill #1

## 2023-10-14 ENCOUNTER — Other Ambulatory Visit (HOSPITAL_COMMUNITY): Payer: Self-pay

## 2023-10-28 ENCOUNTER — Other Ambulatory Visit (HOSPITAL_COMMUNITY): Payer: Self-pay

## 2023-11-13 ENCOUNTER — Other Ambulatory Visit (HOSPITAL_COMMUNITY): Payer: Self-pay

## 2023-11-18 DIAGNOSIS — I10 Essential (primary) hypertension: Secondary | ICD-10-CM | POA: Diagnosis not present

## 2023-11-18 DIAGNOSIS — Z1331 Encounter for screening for depression: Secondary | ICD-10-CM | POA: Diagnosis not present

## 2023-11-19 ENCOUNTER — Other Ambulatory Visit (HOSPITAL_COMMUNITY): Payer: Self-pay

## 2023-11-21 ENCOUNTER — Other Ambulatory Visit (HOSPITAL_COMMUNITY): Payer: Self-pay

## 2023-11-21 MED ORDER — OMEPRAZOLE 20 MG PO CPDR
20.0000 mg | DELAYED_RELEASE_CAPSULE | Freq: Every day | ORAL | 1 refills | Status: DC
  Filled 2023-11-21: qty 90, 90d supply, fill #0
  Filled 2023-12-08 – 2024-02-21 (×2): qty 90, 90d supply, fill #1

## 2023-11-22 ENCOUNTER — Other Ambulatory Visit: Payer: Self-pay

## 2023-12-06 ENCOUNTER — Other Ambulatory Visit: Payer: Self-pay

## 2023-12-06 ENCOUNTER — Other Ambulatory Visit (HOSPITAL_COMMUNITY): Payer: Self-pay

## 2023-12-06 DIAGNOSIS — M545 Low back pain, unspecified: Secondary | ICD-10-CM | POA: Diagnosis not present

## 2023-12-06 DIAGNOSIS — M810 Age-related osteoporosis without current pathological fracture: Secondary | ICD-10-CM | POA: Diagnosis not present

## 2023-12-06 DIAGNOSIS — G8929 Other chronic pain: Secondary | ICD-10-CM | POA: Diagnosis not present

## 2023-12-06 DIAGNOSIS — M0579 Rheumatoid arthritis with rheumatoid factor of multiple sites without organ or systems involvement: Secondary | ICD-10-CM | POA: Diagnosis not present

## 2023-12-06 DIAGNOSIS — N289 Disorder of kidney and ureter, unspecified: Secondary | ICD-10-CM | POA: Diagnosis not present

## 2023-12-06 DIAGNOSIS — Z79899 Other long term (current) drug therapy: Secondary | ICD-10-CM | POA: Diagnosis not present

## 2023-12-06 MED ORDER — HYDROXYCHLOROQUINE SULFATE 200 MG PO TABS
200.0000 mg | ORAL_TABLET | Freq: Every day | ORAL | 1 refills | Status: AC
  Filled 2023-12-06: qty 90, 90d supply, fill #0
  Filled 2024-03-08: qty 90, 90d supply, fill #1

## 2023-12-09 ENCOUNTER — Other Ambulatory Visit (HOSPITAL_COMMUNITY): Payer: Self-pay

## 2023-12-16 DIAGNOSIS — M810 Age-related osteoporosis without current pathological fracture: Secondary | ICD-10-CM | POA: Diagnosis not present

## 2023-12-30 ENCOUNTER — Other Ambulatory Visit (HOSPITAL_COMMUNITY): Payer: Self-pay

## 2023-12-30 MED ORDER — DICLOFENAC SODIUM 50 MG PO TBEC
50.0000 mg | DELAYED_RELEASE_TABLET | Freq: Two times a day (BID) | ORAL | 1 refills | Status: AC
  Filled 2023-12-30: qty 180, 90d supply, fill #0
  Filled 2024-03-25: qty 180, 90d supply, fill #1

## 2023-12-31 ENCOUNTER — Other Ambulatory Visit (HOSPITAL_COMMUNITY): Payer: Self-pay

## 2023-12-31 ENCOUNTER — Other Ambulatory Visit: Payer: Self-pay

## 2024-01-02 DIAGNOSIS — M5459 Other low back pain: Secondary | ICD-10-CM | POA: Diagnosis not present

## 2024-01-07 ENCOUNTER — Other Ambulatory Visit (HOSPITAL_COMMUNITY): Payer: Self-pay

## 2024-01-07 DIAGNOSIS — M5459 Other low back pain: Secondary | ICD-10-CM | POA: Diagnosis not present

## 2024-01-10 DIAGNOSIS — M5459 Other low back pain: Secondary | ICD-10-CM | POA: Diagnosis not present

## 2024-01-13 DIAGNOSIS — M5459 Other low back pain: Secondary | ICD-10-CM | POA: Diagnosis not present

## 2024-01-16 DIAGNOSIS — M5459 Other low back pain: Secondary | ICD-10-CM | POA: Diagnosis not present

## 2024-01-20 ENCOUNTER — Other Ambulatory Visit (HOSPITAL_COMMUNITY): Payer: Self-pay

## 2024-01-20 DIAGNOSIS — M5459 Other low back pain: Secondary | ICD-10-CM | POA: Diagnosis not present

## 2024-01-21 ENCOUNTER — Other Ambulatory Visit (HOSPITAL_COMMUNITY): Payer: Self-pay

## 2024-01-21 ENCOUNTER — Other Ambulatory Visit: Payer: Self-pay

## 2024-01-21 MED ORDER — LOSARTAN POTASSIUM 100 MG PO TABS
100.0000 mg | ORAL_TABLET | Freq: Every day | ORAL | 1 refills | Status: AC
  Filled 2024-01-21: qty 90, 90d supply, fill #0

## 2024-02-04 ENCOUNTER — Other Ambulatory Visit (HOSPITAL_COMMUNITY): Payer: Self-pay

## 2024-02-04 MED ORDER — LEVOTHYROXINE SODIUM 88 MCG PO TABS
88.0000 ug | ORAL_TABLET | Freq: Every day | ORAL | 1 refills | Status: AC
  Filled 2024-02-04: qty 90, 90d supply, fill #0

## 2024-02-09 ENCOUNTER — Other Ambulatory Visit (HOSPITAL_COMMUNITY): Payer: Self-pay

## 2024-02-09 MED ORDER — SIMVASTATIN 20 MG PO TABS
20.0000 mg | ORAL_TABLET | Freq: Every day | ORAL | 1 refills | Status: AC
  Filled 2024-02-09: qty 90, 90d supply, fill #0

## 2024-02-10 ENCOUNTER — Other Ambulatory Visit: Payer: Self-pay

## 2024-02-10 ENCOUNTER — Other Ambulatory Visit (HOSPITAL_COMMUNITY): Payer: Self-pay

## 2024-02-11 DIAGNOSIS — M069 Rheumatoid arthritis, unspecified: Secondary | ICD-10-CM | POA: Diagnosis not present

## 2024-02-11 DIAGNOSIS — Z79899 Other long term (current) drug therapy: Secondary | ICD-10-CM | POA: Diagnosis not present

## 2024-02-21 ENCOUNTER — Other Ambulatory Visit (HOSPITAL_COMMUNITY): Payer: Self-pay

## 2024-03-09 ENCOUNTER — Other Ambulatory Visit (HOSPITAL_COMMUNITY): Payer: Self-pay

## 2024-03-25 ENCOUNTER — Other Ambulatory Visit: Payer: Self-pay

## 2024-04-07 ENCOUNTER — Other Ambulatory Visit: Payer: Self-pay

## 2024-04-07 ENCOUNTER — Other Ambulatory Visit: Payer: Self-pay | Admitting: Physician Assistant

## 2024-04-07 ENCOUNTER — Other Ambulatory Visit (HOSPITAL_COMMUNITY): Payer: Self-pay

## 2024-04-07 DIAGNOSIS — Z1231 Encounter for screening mammogram for malignant neoplasm of breast: Secondary | ICD-10-CM

## 2024-04-07 MED ORDER — SIMVASTATIN 20 MG PO TABS
20.0000 mg | ORAL_TABLET | Freq: Every day | ORAL | 1 refills | Status: AC
  Filled 2024-05-11: qty 90, 90d supply, fill #0

## 2024-04-07 MED ORDER — LOSARTAN POTASSIUM 100 MG PO TABS
100.0000 mg | ORAL_TABLET | Freq: Every day | ORAL | 1 refills | Status: AC
  Filled 2024-04-07: qty 90, 90d supply, fill #0

## 2024-04-07 MED ORDER — LEVOTHYROXINE SODIUM 88 MCG PO TABS
88.0000 ug | ORAL_TABLET | Freq: Every day | ORAL | 1 refills | Status: AC
  Filled 2024-05-03: qty 90, 90d supply, fill #0

## 2024-05-04 ENCOUNTER — Other Ambulatory Visit: Payer: Self-pay

## 2024-05-04 ENCOUNTER — Other Ambulatory Visit (HOSPITAL_COMMUNITY): Payer: Self-pay

## 2024-05-11 ENCOUNTER — Other Ambulatory Visit (HOSPITAL_COMMUNITY): Payer: Self-pay

## 2024-05-11 ENCOUNTER — Other Ambulatory Visit: Payer: Self-pay

## 2024-05-17 ENCOUNTER — Other Ambulatory Visit (HOSPITAL_COMMUNITY): Payer: Self-pay

## 2024-05-19 ENCOUNTER — Other Ambulatory Visit (HOSPITAL_COMMUNITY): Payer: Self-pay

## 2024-05-19 MED ORDER — OMEPRAZOLE 20 MG PO CPDR
20.0000 mg | DELAYED_RELEASE_CAPSULE | Freq: Every day | ORAL | 1 refills | Status: AC
  Filled 2024-05-19: qty 90, 90d supply, fill #0
